# Patient Record
Sex: Female | Born: 1965 | Race: White | Hispanic: No | Smoking: Never smoker
Health system: Southern US, Community
[De-identification: ages and names within clinical notes are randomized; demographics above are authoritative.]

## PROBLEM LIST (undated history)

## (undated) DIAGNOSIS — R519 Headache, unspecified: Secondary | ICD-10-CM

## (undated) DIAGNOSIS — Z9889 Other specified postprocedural states: Secondary | ICD-10-CM

## (undated) DIAGNOSIS — R42 Dizziness and giddiness: Secondary | ICD-10-CM

## (undated) DIAGNOSIS — T753XXA Motion sickness, initial encounter: Secondary | ICD-10-CM

## (undated) DIAGNOSIS — K219 Gastro-esophageal reflux disease without esophagitis: Secondary | ICD-10-CM

## (undated) DIAGNOSIS — L309 Dermatitis, unspecified: Secondary | ICD-10-CM

## (undated) DIAGNOSIS — R112 Nausea with vomiting, unspecified: Secondary | ICD-10-CM

## (undated) DIAGNOSIS — Z789 Other specified health status: Secondary | ICD-10-CM

## (undated) DIAGNOSIS — Z0282 Encounter for adoption services: Secondary | ICD-10-CM

## (undated) DIAGNOSIS — R51 Headache: Secondary | ICD-10-CM

---

## 1997-10-24 ENCOUNTER — Other Ambulatory Visit: Admission: RE | Admit: 1997-10-24 | Discharge: 1997-10-24 | Payer: Self-pay | Admitting: Obstetrics and Gynecology

## 1998-05-19 HISTORY — PX: SHOULDER SURGERY: SHX246

## 1998-10-26 ENCOUNTER — Other Ambulatory Visit: Admission: RE | Admit: 1998-10-26 | Discharge: 1998-10-26 | Payer: Self-pay | Admitting: Obstetrics and Gynecology

## 1998-12-05 ENCOUNTER — Encounter (INDEPENDENT_AMBULATORY_CARE_PROVIDER_SITE_OTHER): Payer: Self-pay

## 1998-12-05 ENCOUNTER — Other Ambulatory Visit: Admission: RE | Admit: 1998-12-05 | Discharge: 1998-12-05 | Payer: Self-pay | Admitting: Obstetrics and Gynecology

## 2000-01-30 ENCOUNTER — Other Ambulatory Visit: Admission: RE | Admit: 2000-01-30 | Discharge: 2000-01-30 | Payer: Self-pay | Admitting: Obstetrics and Gynecology

## 2000-01-30 ENCOUNTER — Encounter (INDEPENDENT_AMBULATORY_CARE_PROVIDER_SITE_OTHER): Payer: Self-pay | Admitting: Specialist

## 2000-06-23 ENCOUNTER — Other Ambulatory Visit: Admission: RE | Admit: 2000-06-23 | Discharge: 2000-06-23 | Payer: Self-pay | Admitting: *Deleted

## 2000-08-11 ENCOUNTER — Ambulatory Visit (HOSPITAL_COMMUNITY): Admission: RE | Admit: 2000-08-11 | Discharge: 2000-08-11 | Payer: Self-pay | Admitting: *Deleted

## 2000-08-11 ENCOUNTER — Encounter: Payer: Self-pay | Admitting: *Deleted

## 2000-09-04 ENCOUNTER — Emergency Department (HOSPITAL_COMMUNITY): Admission: EM | Admit: 2000-09-04 | Discharge: 2000-09-04 | Payer: Self-pay | Admitting: Emergency Medicine

## 2000-10-15 ENCOUNTER — Other Ambulatory Visit: Admission: RE | Admit: 2000-10-15 | Discharge: 2000-10-15 | Payer: Self-pay | Admitting: *Deleted

## 2001-02-18 ENCOUNTER — Other Ambulatory Visit: Admission: RE | Admit: 2001-02-18 | Discharge: 2001-02-18 | Payer: Self-pay | Admitting: Obstetrics and Gynecology

## 2001-05-14 ENCOUNTER — Encounter: Admission: RE | Admit: 2001-05-14 | Discharge: 2001-05-14 | Payer: Self-pay | Admitting: *Deleted

## 2001-05-14 ENCOUNTER — Encounter: Payer: Self-pay | Admitting: *Deleted

## 2001-07-16 ENCOUNTER — Encounter: Admission: RE | Admit: 2001-07-16 | Discharge: 2001-07-16 | Payer: Self-pay | Admitting: Internal Medicine

## 2001-07-16 ENCOUNTER — Encounter: Payer: Self-pay | Admitting: Internal Medicine

## 2001-08-26 ENCOUNTER — Ambulatory Visit (HOSPITAL_COMMUNITY): Admission: RE | Admit: 2001-08-26 | Discharge: 2001-08-26 | Payer: Self-pay | Admitting: Gastroenterology

## 2001-08-26 ENCOUNTER — Encounter: Payer: Self-pay | Admitting: Gastroenterology

## 2002-02-10 ENCOUNTER — Other Ambulatory Visit: Admission: RE | Admit: 2002-02-10 | Discharge: 2002-02-10 | Payer: Self-pay | Admitting: Obstetrics and Gynecology

## 2003-05-29 ENCOUNTER — Other Ambulatory Visit: Admission: RE | Admit: 2003-05-29 | Discharge: 2003-05-29 | Payer: Self-pay | Admitting: Obstetrics and Gynecology

## 2004-04-05 ENCOUNTER — Ambulatory Visit: Payer: Self-pay | Admitting: Family Medicine

## 2004-04-07 ENCOUNTER — Ambulatory Visit (HOSPITAL_COMMUNITY): Admission: RE | Admit: 2004-04-07 | Discharge: 2004-04-07 | Payer: Self-pay | Admitting: Family Medicine

## 2004-05-19 HISTORY — PX: ANKLE SURGERY: SHX546

## 2004-06-19 ENCOUNTER — Encounter: Admission: RE | Admit: 2004-06-19 | Discharge: 2004-06-19 | Payer: Self-pay | Admitting: Obstetrics and Gynecology

## 2005-04-21 ENCOUNTER — Ambulatory Visit: Payer: Self-pay | Admitting: Family Medicine

## 2006-08-10 ENCOUNTER — Ambulatory Visit: Payer: Self-pay | Admitting: Family Medicine

## 2007-08-26 ENCOUNTER — Telehealth (INDEPENDENT_AMBULATORY_CARE_PROVIDER_SITE_OTHER): Payer: Self-pay | Admitting: *Deleted

## 2008-05-19 HISTORY — PX: AUGMENTATION MAMMAPLASTY: SUR837

## 2009-07-21 ENCOUNTER — Ambulatory Visit: Payer: Self-pay | Admitting: Diagnostic Radiology

## 2009-07-21 ENCOUNTER — Emergency Department (HOSPITAL_BASED_OUTPATIENT_CLINIC_OR_DEPARTMENT_OTHER): Admission: EM | Admit: 2009-07-21 | Discharge: 2009-07-21 | Payer: Self-pay | Admitting: Emergency Medicine

## 2011-10-10 ENCOUNTER — Other Ambulatory Visit: Payer: Self-pay | Admitting: *Deleted

## 2011-10-10 DIAGNOSIS — Z1231 Encounter for screening mammogram for malignant neoplasm of breast: Secondary | ICD-10-CM

## 2011-10-31 ENCOUNTER — Ambulatory Visit
Admission: RE | Admit: 2011-10-31 | Discharge: 2011-10-31 | Disposition: A | Discharge status: Home / Self Care | Payer: Managed Care, Other (non HMO) | Source: Ambulatory Visit | Attending: *Deleted | Admitting: *Deleted

## 2011-10-31 ENCOUNTER — Other Ambulatory Visit: Payer: Self-pay | Admitting: *Deleted

## 2011-10-31 DIAGNOSIS — Z1231 Encounter for screening mammogram for malignant neoplasm of breast: Secondary | ICD-10-CM

## 2011-11-05 ENCOUNTER — Other Ambulatory Visit: Payer: Self-pay | Admitting: *Deleted

## 2011-11-05 DIAGNOSIS — R928 Other abnormal and inconclusive findings on diagnostic imaging of breast: Secondary | ICD-10-CM

## 2011-11-06 ENCOUNTER — Other Ambulatory Visit: Payer: Self-pay | Admitting: Family Medicine

## 2011-11-14 ENCOUNTER — Ambulatory Visit
Admission: RE | Admit: 2011-11-14 | Discharge: 2011-11-14 | Disposition: A | Discharge status: Home / Self Care | Payer: Managed Care, Other (non HMO) | Source: Ambulatory Visit | Attending: *Deleted | Admitting: *Deleted

## 2011-11-14 DIAGNOSIS — R928 Other abnormal and inconclusive findings on diagnostic imaging of breast: Secondary | ICD-10-CM

## 2012-02-11 ENCOUNTER — Ambulatory Visit: Payer: Self-pay | Admitting: Family Medicine

## 2012-03-29 ENCOUNTER — Ambulatory Visit: Payer: Self-pay | Admitting: Medical

## 2012-03-29 LAB — URINALYSIS, COMPLETE
Bilirubin,UR: NEGATIVE
Ketone: NEGATIVE
Leukocyte Esterase: NEGATIVE
RBC,UR: NONE SEEN /HPF (ref 0–5)

## 2012-03-29 LAB — PREGNANCY, URINE: Pregnancy Test, Urine: NEGATIVE m[IU]/mL

## 2012-03-29 LAB — WET PREP, GENITAL

## 2012-03-31 LAB — URINE CULTURE

## 2012-04-02 ENCOUNTER — Ambulatory Visit: Payer: Self-pay | Admitting: Medical

## 2013-05-28 ENCOUNTER — Ambulatory Visit: Payer: Self-pay | Admitting: Physician Assistant

## 2013-07-09 ENCOUNTER — Ambulatory Visit: Payer: Self-pay | Admitting: Internal Medicine

## 2013-08-11 ENCOUNTER — Ambulatory Visit: Payer: Self-pay | Admitting: Internal Medicine

## 2013-08-11 ENCOUNTER — Ambulatory Visit: Payer: Self-pay | Admitting: Gastroenterology

## 2013-08-12 LAB — PATHOLOGY REPORT

## 2013-08-26 ENCOUNTER — Ambulatory Visit: Payer: Self-pay | Admitting: Internal Medicine

## 2014-04-30 ENCOUNTER — Ambulatory Visit: Payer: Self-pay | Admitting: Physician Assistant

## 2014-04-30 LAB — RAPID STREP-A WITH REFLX: Micro Text Report: NEGATIVE

## 2014-05-03 LAB — BETA STREP CULTURE(ARMC)

## 2014-05-14 ENCOUNTER — Ambulatory Visit: Payer: Self-pay | Admitting: Family Medicine

## 2014-05-14 LAB — RAPID STREP-A WITH REFLX: MICRO TEXT REPORT: NEGATIVE

## 2014-05-17 LAB — BETA STREP CULTURE(ARMC)

## 2014-11-05 ENCOUNTER — Encounter: Payer: Self-pay | Admitting: Emergency Medicine

## 2014-11-05 ENCOUNTER — Ambulatory Visit
Admission: EM | Admit: 2014-11-05 | Discharge: 2014-11-05 | Disposition: A | Discharge status: Home / Self Care | Payer: Managed Care, Other (non HMO) | Attending: Family Medicine | Admitting: Family Medicine

## 2014-11-05 DIAGNOSIS — W57XXXA Bitten or stung by nonvenomous insect and other nonvenomous arthropods, initial encounter: Secondary | ICD-10-CM | POA: Diagnosis not present

## 2014-11-05 DIAGNOSIS — T148 Other injury of unspecified body region: Secondary | ICD-10-CM

## 2014-11-05 DIAGNOSIS — S20469A Insect bite (nonvenomous) of unspecified back wall of thorax, initial encounter: Secondary | ICD-10-CM | POA: Diagnosis not present

## 2014-11-05 DIAGNOSIS — R21 Rash and other nonspecific skin eruption: Secondary | ICD-10-CM | POA: Diagnosis present

## 2014-11-05 HISTORY — DX: Headache, unspecified: R51.9

## 2014-11-05 HISTORY — DX: Dermatitis, unspecified: L30.9

## 2014-11-05 HISTORY — DX: Headache: R51

## 2014-11-05 LAB — URINALYSIS COMPLETE WITH MICROSCOPIC (ARMC ONLY)
BACTERIA UA: NONE SEEN — AB
BILIRUBIN URINE: NEGATIVE
Glucose, UA: NEGATIVE mg/dL
HGB URINE DIPSTICK: NEGATIVE
Ketones, ur: NEGATIVE mg/dL
LEUKOCYTES UA: NEGATIVE
NITRITE: NEGATIVE
PROTEIN: NEGATIVE mg/dL
RBC / HPF: NONE SEEN RBC/hpf (ref <3)
Specific Gravity, Urine: 1.025 (ref 1.005–1.030)
pH: 6.5 (ref 5.0–8.0)

## 2014-11-05 NOTE — ED Notes (Addendum)
Rash for 5 days on Torso and under breast. Has been exposed to Shingles. Odor when urinating today.

## 2014-11-07 LAB — URINE CULTURE

## 2014-11-09 ENCOUNTER — Encounter: Payer: Self-pay | Admitting: Physician Assistant

## 2014-11-09 NOTE — ED Provider Notes (Signed)
CSN: [id number]     Arrival date & time 11/05/14  1547 History   None    Chief Complaint  Patient presents with  . Rash   (Consider location/radiation/quality/duration/timing/severity/associated sxs/prior Treatment) HPI  49 yo F with known hx eczema. Anxious about a "new rash" noted in past few days. Mild itch, not spreading, no pain, no fever, no malaise. Along bra line on abdomen/thorax. Particularly anxious because a friend just got diagnosed with Zoster.Also concerned because urine was "more yellow" today and might have had an odor-denies dysuria or frequency;fever or NVD back pain  Past Medical History  Diagnosis Date  . Eczema   . Headache    Past Surgical History  Procedure Laterality Date  . Shoulder surgery Right   . Ankle surgery Right 2006   History reviewed. No pertinent family history. History  Substance Use Topics  . Smoking status: Never Smoker   . Smokeless tobacco: Never Used  . Alcohol Use: No   OB History    No data available     Review of Systems Review of 10 systems negative for acute change except as referenced in HPI  Allergies  Review of patient's allergies indicates no known allergies.  Home Medications   Prior to Admission medications   Medication Sig Start Date End Date Taking? Authorizing Provider  dexlansoprazole (DEXILANT) 60 MG capsule Take 60 mg by mouth daily.   Yes Historical Provider, MD  Dexamethasone Ace & Sod Phos (DEX COMBO) 8-4 MG/ML SUSP Inject as directed.    Historical Provider, MD   BP 118/70 mmHg  Pulse 87  Temp(Src) 97.9 F (36.6 C) (Oral)  Resp 16  Ht 5\' 2"  (1.575 m)  Wt 158 lb (71.668 kg)  BMI 28.89 kg/m2  SpO2 99%  LMP 11/01/2014 Physical Exam  Constitutional -alert and oriented,well appearing and in no acute distress Head-atraumatic Eyes- ,conjugate gaze Nose- no congestion or rhinorrhea Mouth/throat- mucous membranes moist  Neck- supple without glandular enlargement CV- regular rate, grossly normal heart  sounds, Resp-no distress, normal respiratory effort,clear to auscultation bilaterally GI- soft,non-tender,no distention...there are 4 tiny red spots along lower edge of bra elastic--very representative of small insect bite. Not inflamed, not infected-appear to be resolving Back- WNL no CVAT GU- not examined MSK- no lower extremity tenderness nor edema,no joint effusion, ambulatory Neuro- normal speech and language,  Skin-warm,dry ,intact; no rash noted- see GI- suspect resolving insect bite Psych-mood and affect grossly normal; speech and behavior grossly normal  ED Course  Procedures (including critical care time) Labs Review Labs Reviewed  URINALYSIS COMPLETEWITH MICROSCOPIC ([facility] ONLY) - Abnormal; Notable for the following:    Color, Urine STRAW (*)    Bacteria, UA NONE SEEN (*)    Squamous Epithelial / LPF 0-5 (*)    All other components within normal limits  URINE CULTURE    Imaging Review No results found.   MDM   1. Insect bite     Findings suggest resolving insect bite and patient is reassured. Urine is negative and she is encouraged to increase her po fluids. She is relaxed and comfortable by her report at discharge Diagnosis and treatment discussed. . Questions fielded, expectations and recommendations reviewed. Patient expresses understanding. Will return to [facility] with questions, concern or exacerbation.     Jan Fireman, PA-C 11/09/14 [phone]

## 2015-03-31 ENCOUNTER — Other Ambulatory Visit: Payer: Self-pay | Admitting: Gastroenterology

## 2015-08-16 ENCOUNTER — Encounter: Payer: Self-pay | Admitting: Emergency Medicine

## 2015-08-16 ENCOUNTER — Ambulatory Visit
Admission: EM | Admit: 2015-08-16 | Discharge: 2015-08-16 | Disposition: A | Discharge status: Home / Self Care | Payer: Managed Care, Other (non HMO) | Attending: Family Medicine | Admitting: Family Medicine

## 2015-08-16 DIAGNOSIS — R42 Dizziness and giddiness: Secondary | ICD-10-CM

## 2015-08-16 DIAGNOSIS — H81319 Aural vertigo, unspecified ear: Secondary | ICD-10-CM | POA: Diagnosis not present

## 2015-08-16 DIAGNOSIS — R112 Nausea with vomiting, unspecified: Secondary | ICD-10-CM | POA: Diagnosis not present

## 2015-08-16 MED ORDER — MECLIZINE HCL 25 MG PO TABS
25.0000 mg | ORAL_TABLET | Freq: Three times a day (TID) | ORAL | Status: DC | PRN
  Administered 2015-08-16: 25 mg via ORAL

## 2015-08-16 MED ORDER — ONDANSETRON 8 MG PO TBDP: 8.0000 mg | ORAL_TABLET | Freq: Three times a day (TID) | ORAL | Status: DC | PRN

## 2015-08-16 MED ORDER — ONDANSETRON 8 MG PO TBDP
8.0000 mg | ORAL_TABLET | Freq: Once | ORAL | Status: AC
  Administered 2015-08-16: 8 mg via ORAL

## 2015-08-16 MED ORDER — MECLIZINE HCL 25 MG PO TABS: 25.0000 mg | ORAL_TABLET | Freq: Three times a day (TID) | ORAL | Status: DC | PRN

## 2015-08-16 NOTE — Discharge Instructions (Signed)
Dizziness Dizziness is a common problem. It makes you feel unsteady or lightheaded. You may feel like you are about to pass out (faint). Dizziness can lead to injury if you stumble or fall. Anyone can get dizzy, but dizziness is more common in older adults. This condition can be caused by a number of things, including:  Medicines.  Dehydration.  Illness. HOME CARE Following these instructions may help with your condition: Eating and Drinking  Drink enough fluid to keep your pee (urine) clear or pale yellow. This helps to keep you from getting dehydrated. Try to drink more clear fluids, such as water.  Do not drink alcohol.  Limit how much caffeine you drink or eat if told by your doctor.  Limit how much salt you drink or eat if told by your doctor. Activity  Avoid making quick movements.  When you stand up from sitting in a chair, steady yourself until you feel okay.  In the morning, first sit up on the side of the bed. When you feel okay, stand slowly while you hold onto something. Do this until you know that your balance is fine.  Move your legs often if you need to stand in one place for a long time. Tighten and relax your muscles in your legs while you are standing.  Do not drive or use heavy machinery if you feel dizzy.  Avoid bending down if you feel dizzy. Place items in your home so that they are easy for you to reach without leaning over. Lifestyle  Do not use any tobacco products, including cigarettes, chewing tobacco, or electronic cigarettes. If you need help quitting, ask your doctor.  Try to lower your stress level, such as with yoga or meditation. Talk with your doctor if you need help. General Instructions  Watch your dizziness for any changes.  Take medicines only as told by your doctor. Talk with your doctor if you think that your dizziness is caused by a medicine that you are taking.  Tell a friend or a family member that you are feeling dizzy. If he or  she notices any changes in your behavior, have this person call your doctor.  Keep all follow-up visits as told by your doctor. This is important. GET HELP IF:  Your dizziness does not go away.  Your dizziness or light-headedness gets worse.  You feel sick to your stomach (nauseous).  You have trouble hearing.  You have new symptoms.  You are unsteady on your feet or you feel like the room is spinning. GET HELP RIGHT AWAY IF:  You throw up (vomit) or have diarrhea and are unable to eat or drink anything.  You have trouble:  Talking.  Walking.  Swallowing.  Using your arms, hands, or legs.  You feel generally weak.  You are not thinking clearly or you have trouble forming sentences. It may take a friend or family member to notice this.  You have:  Chest pain.  Pain in your belly (abdomen).  Shortness of breath.  Sweating.  Your vision changes.  You are bleeding.  You have a headache.  You have neck pain or a stiff neck.  You have a fever.   This information is not intended to replace advice given to you by your health care provider. Make sure you discuss any questions you have with your health care provider.   Document Released: 04/24/2011 Document Revised: 09/19/2014 Document Reviewed: 05/01/2014 Elsevier Interactive Patient Education 2016 Elsevier Inc.  Vertigo Vertigo means that  you feel like you are moving when you are not. Vertigo can also make you feel like things around you are moving when they are not. This feeling can come and go at any time. Vertigo often goes away on its own. HOME CARE  Avoid making fast movements.  Avoid driving.  Avoid using heavy machinery.  Avoid doing any task or activity that might cause danger to you or other people if you would have a vertigo attack while you are doing it.  Sit down right away if you feel dizzy or have trouble with your balance.  Take over-the-counter and prescription medicines only as told  by your doctor.  Follow instructions from your doctor about which positions or movements you should avoid.  Drink enough fluid to keep your pee (urine) clear or pale yellow.  Keep all follow-up visits as told by your doctor. This is important. GET HELP IF:  Medicine does not help your vertigo.  You have a fever.  Your problems get worse or you have new symptoms.  Your family or friends see changes in your behavior.  You feel sick to your stomach (nauseous) or you throw up (vomit).  You have a "pins and needles" feeling or you are numb in part of your body. GET HELP RIGHT AWAY IF:  You have trouble moving or talking.  You are always dizzy.  You pass out (faint).  You get very bad headaches.  You feel weak or have trouble using your hands, arms, or legs.  You have changes in your hearing.  You have changes in your seeing (vision).  You get a stiff neck.  Bright light starts to bother you.   This information is not intended to replace advice given to you by your health care provider. Make sure you discuss any questions you have with your health care provider.   Document Released: 02/12/2008 Document Revised: 01/24/2015 Document Reviewed: 08/28/2014 Elsevier Interactive Patient Education [employer].

## 2015-08-16 NOTE — ED Provider Notes (Signed)
CSN: [id number]     Arrival date & time 08/16/15  1346 History   First MD Initiated Contact with Patient 08/16/15 1433    Nurses notes were reviewed. Chief Complaint  Patient presents with  . Nausea  . Dizziness   Patient reports 4:00 this morning she felt lightheaded dizzy and felt swimming as she was drunk. She got up and saw that there was 4:00 on her watch that had a big face to it. 20 bathroom and threw up she started about 2-3 more times since then to get some Dramamine which has help she just feels lightheaded and dizzy as if she is drunk.  Patient is adopted she's had shoulder surgery ankle surgery and breast surgery. She is currently going to weight loss clinic is on several medications and treatments for that she's been on it for over a week now. She is also taking phentermine for a taking half a dose now and she's never had trouble with phentermine before.  Patient does not smoke and states that she she is allergic to latex   (Consider location/radiation/quality/duration/timing/severity/associated sxs/prior Treatment) Patient is a 50 y.o. female presenting with dizziness. The history is provided by the patient. No language interpreter was used.  Dizziness Quality:  Head spinning, lightheadedness and room spinning Severity:  Moderate Onset quality:  Sudden Duration:  12 hours Timing:  Constant Progression:  Waxing and waning Chronicity:  New Relieved by: Dramamine. Worsened by:  Movement Ineffective treatments:  Change in position Associated symptoms: nausea, vomiting and weakness   Risk factors: multiple medications and new medications   Risk factors: no hx of vertigo     Past Medical History  Diagnosis Date  . Eczema   . Headache    Past Surgical History  Procedure Laterality Date  . Shoulder surgery Right   . Ankle surgery Right 2006  . Breast surgery     History reviewed. No pertinent family history. Social History  Substance Use Topics  . Smoking status:  Never Smoker   . Smokeless tobacco: Never Used  . Alcohol Use: No   OB History    No data available     Review of Systems  Gastrointestinal: Positive for nausea and vomiting.  Neurological: Positive for dizziness and weakness.  All other systems reviewed and are negative.   Allergies  Review of patient's allergies indicates no known allergies.  Home Medications   Prior to Admission medications   Medication Sig Start Date End Date Taking? Authorizing Provider  Cyanocobalamin (B-12 COMPLIANCE INJECTION) 1000 MCG/ML KIT Inject as directed.   Yes Historical Provider, MD  human chorionic gonadotropin (PREGNYL/NOVAREL) 10000 units injection Inject 25 Units into the muscle once.   Yes Historical Provider, MD  Nutritional Supplements (LIPOTROPIC COMPLEX PO) Take 50 mg by mouth.   Yes Historical Provider, MD  omeprazole (PRILOSEC) 40 MG capsule Take 40 mg by mouth daily.   Yes Historical Provider, MD  PHENTERMINE HCL PO Take 1 tablet by mouth daily.   Yes Historical Provider, MD  Dexamethasone Ace & Sod Phos (DEX COMBO) 8-4 MG/ML SUSP Inject as directed.    Historical Provider, MD  DEXILANT 60 MG capsule TAKE ONE CAPSULE BY MOUTH EVERY DAY 04/02/15   Lucilla Lame, MD  meclizine (ANTIVERT) 25 MG tablet Take 1 tablet (25 mg total) by mouth 3 (three) times daily as needed for dizziness. 08/16/15   Frederich Cha, MD  ondansetron (ZOFRAN ODT) 8 MG disintegrating tablet Take 1 tablet (8 mg total) by mouth every 8 (  eight) hours as needed for nausea or vomiting. 08/16/15   Frederich Cha, MD   Meds Ordered and Administered this Visit   Medications  meclizine (ANTIVERT) tablet 25 mg (25 mg Oral Given 08/16/15 1508)  ondansetron (ZOFRAN-ODT) disintegrating tablet 8 mg (8 mg Oral Given 08/16/15 1508)    BP 109/48 mmHg  Pulse 93  Temp(Src) 98.8 F (37.1 C) (Tympanic)  Resp 16  Ht '5\' 2"'$  (1.575 m)  Wt 156 lb (70.761 kg)  BMI 28.53 kg/m2  SpO2 99%  LMP 07/21/2015 (Exact Date) No data  found.   Physical Exam  Constitutional: She is oriented to person, place, and time. She appears well-developed.  HENT:  Head: Normocephalic.  Right Ear: Hearing, tympanic membrane, external ear and ear canal normal.  Left Ear: Hearing, tympanic membrane, external ear and ear canal normal.  Nose: No mucosal edema or rhinorrhea.  Mouth/Throat: Normal dentition. No dental caries. No oropharyngeal exudate or posterior oropharyngeal edema.  Eyes: Conjunctivae are normal. Pupils are equal, round, and reactive to light.  Neck: Normal range of motion. Neck supple.  Cardiovascular: Normal rate and regular rhythm.   Pulmonary/Chest: Effort normal and breath sounds normal.  Abdominal: Soft.  Musculoskeletal: Normal range of motion. She exhibits no edema.  Neurological: She is alert and oriented to person, place, and time.  Skin: Skin is warm and dry.  Psychiatric: She has a normal mood and affect.  Vitals reviewed.   ED Course  Procedures (including critical care time)  Labs Review Labs Reviewed - No data to display  Imaging Review No results found.   Visual Acuity Review  Right Eye Distance:   Left Eye Distance:   Bilateral Distance:    Right Eye Near:   Left Eye Near:    Bilateral Near:         MDM   1. Vertigo, aural, unspecified laterality   2. Dizziness   3. Non-intractable vomiting with nausea, vomiting of unspecified type     Attempts were made to do reverse rotation of the head would help this Mnire's disease did not. We'll place patient on Zofran of Zofran Antivert works great not answer Phenergan and Antivert respect things be better to 3 days if not she'll need to come back for follow-up with see her PCP for follow-up.  Patient reports feeling much improved since taking the Zofran and Antivert here.  Note: This dictation was prepared with Dragon dictation along with smaller phrase technology. Any transcriptional errors that result from this process are  unintentional.   Frederich Cha, MD 08/16/15 [phone]

## 2015-08-16 NOTE — ED Notes (Signed)
Patient c/o dizziness and nausea that started early this morning. Patient states that she took a dramamine tablet and had some improvement.

## 2015-08-17 ENCOUNTER — Encounter: Payer: Self-pay | Admitting: Internal Medicine

## 2015-09-11 ENCOUNTER — Ambulatory Visit (INDEPENDENT_AMBULATORY_CARE_PROVIDER_SITE_OTHER): Payer: Managed Care, Other (non HMO) | Admitting: Internal Medicine

## 2015-09-11 ENCOUNTER — Encounter: Payer: Self-pay | Admitting: Internal Medicine

## 2015-09-11 VITALS — BP 118/80 | HR 84 | Ht 62.0 in | Wt 154.4 lb

## 2015-09-11 DIAGNOSIS — E559 Vitamin D deficiency, unspecified: Secondary | ICD-10-CM | POA: Insufficient documentation

## 2015-09-11 DIAGNOSIS — N6320 Unspecified lump in the left breast, unspecified quadrant: Secondary | ICD-10-CM

## 2015-09-11 DIAGNOSIS — N63 Unspecified lump in breast: Secondary | ICD-10-CM

## 2015-09-11 DIAGNOSIS — K219 Gastro-esophageal reflux disease without esophagitis: Secondary | ICD-10-CM | POA: Insufficient documentation

## 2015-09-11 DIAGNOSIS — E782 Mixed hyperlipidemia: Secondary | ICD-10-CM | POA: Insufficient documentation

## 2015-09-11 DIAGNOSIS — R42 Dizziness and giddiness: Secondary | ICD-10-CM | POA: Diagnosis not present

## 2015-09-11 DIAGNOSIS — Z8742 Personal history of other diseases of the female genital tract: Secondary | ICD-10-CM | POA: Insufficient documentation

## 2015-09-11 DIAGNOSIS — G43909 Migraine, unspecified, not intractable, without status migrainosus: Secondary | ICD-10-CM | POA: Insufficient documentation

## 2015-09-11 NOTE — Patient Instructions (Signed)
Breast Self-Awareness Practicing breast self-awareness may pick up problems early, prevent significant medical complications, and possibly save your life. By practicing breast self-awareness, you can become familiar with how your breasts look and feel and if your breasts are changing. This allows you to notice changes early. It can also offer you some reassurance that your breast health is good. One way to learn what is normal for your breasts and whether your breasts are changing is to do a breast self-exam. If you find a lump or something that was not present in the past, it is best to contact your caregiver right away. Other findings that should be evaluated by your caregiver include nipple discharge, especially if it is bloody; skin changes or reddening; areas where the skin seems to be pulled in (retracted); or new lumps and bumps. Breast pain is seldom associated with cancer (malignancy), but should also be evaluated by a caregiver. HOW TO PERFORM A BREAST SELF-EXAM The best time to examine your breasts is 5-7 days after your menstrual period is over. During menstruation, the breasts are lumpier, and it may be more difficult to pick up changes. If you do not menstruate, have reached menopause, or had your uterus removed (hysterectomy), you should examine your breasts at regular intervals, such as monthly. If you are breastfeeding, examine your breasts after a feeding or after using a breast pump. Breast implants do not decrease the risk for lumps or tumors, so continue to perform breast self-exams as recommended. Talk to your caregiver about how to determine the difference between the implant and breast tissue. Also, talk about the amount of pressure you should use during the exam. Over time, you will become more familiar with the variations of your breasts and more comfortable with the exam. A breast self-exam requires you to remove all your clothes above the waist. 1. Look at your breasts and nipples.  Stand in front of a mirror in a room with good lighting. With your hands on your hips, push your hands firmly downward. Look for a difference in shape, contour, and size from one breast to the other (asymmetry). Asymmetry includes puckers, dips, or bumps. Also, look for skin changes, such as reddened or scaly areas on the breasts. Look for nipple changes, such as discharge, dimpling, repositioning, or redness. 2. Carefully feel your breasts. This is best done either in the shower or tub while using soapy water or when flat on your back. Place the arm (on the side of the breast you are examining) above your head. Use the pads (not the fingertips) of your three middle fingers on your opposite hand to feel your breasts. Start in the underarm area and use  inch (2 cm) overlapping circles to feel your breast. Use 3 different levels of pressure (light, medium, and firm pressure) at each circle before moving to the next circle. The light pressure is needed to feel the tissue closest to the skin. The medium pressure will help to feel breast tissue a little deeper, while the firm pressure is needed to feel the tissue close to the ribs. Continue the overlapping circles, moving downward over the breast until you feel your ribs below your breast. Then, move one finger-width towards the center of the body. Continue to use the  inch (2 cm) overlapping circles to feel your breast as you move slowly up toward the collar bone (clavicle) near the base of the neck. Continue the up and down exam using all 3 pressures until you reach the   middle of the chest. Do this with each breast, carefully feeling for lumps or changes. 3.  Keep a written record with breast changes or normal findings for each breast. By writing this information down, you do not need to depend only on memory for size, tenderness, or location. Write down where you are in your menstrual cycle, if you are still menstruating. Breast tissue can have some lumps or  thick tissue. However, see your caregiver if you find anything that concerns you.  SEEK MEDICAL CARE IF:  You see a change in shape, contour, or size of your breasts or nipples.   You see skin changes, such as reddened or scaly areas on the breasts or nipples.   You have an unusual discharge from your nipples.   You feel a new lump or unusually thick areas.    This information is not intended to replace advice given to you by your health care provider. Make sure you discuss any questions you have with your health care provider.   Document Released: 05/05/2005 Document Revised: 04/21/2012 Document Reviewed: 08/20/2011 Elsevier Interactive Patient Education 2016 Elsevier Inc.  

## 2015-09-11 NOTE — Progress Notes (Signed)
Date:  09/11/2015   Name:  Caitlin Ware   DOB:  1965-10-23   MRN:  [id number]   Chief Complaint: Breast Pain and Dizziness Dizziness This is a new problem. The current episode started 1 to 4 weeks ago. Progression since onset: lasted 3 days last month, now mild recurrence since Sunday. Pertinent negatives include no abdominal pain, arthralgias, chest pain, chills, congestion, coughing, fatigue, fever, headaches, joint swelling or weakness.   Breast discomfort - off and on on the left breast after exercise.  Seems to be since surgery.  She did more extensive exercise Saturday and now she has a lump on the left.  Her last mammogram was 2015 - she was supposed to have a 6 month follow up but did not follow through.   Review of Systems  Constitutional: Negative for fever, chills and fatigue.  HENT: Negative for congestion, ear pain and sinus pressure.   Respiratory: Negative for cough, chest tightness and shortness of breath.   Cardiovascular: Negative for chest pain.  Gastrointestinal: Negative for abdominal pain.  Musculoskeletal: Negative for joint swelling and arthralgias.  Neurological: Positive for dizziness. Negative for syncope, weakness and headaches.    Patient Active Problem List   Diagnosis Date Noted  . Migraine headache 09/11/2015  . GERD (gastroesophageal reflux disease) 09/11/2015  . Hyperlipidemia, mixed 09/11/2015  . Hx of abnormal cervical Papanicolaou smear 09/11/2015    Prior to Admission medications   Medication Sig Start Date End Date Taking? Authorizing Provider  Cetirizine HCl 10 MG TBDP Take by mouth.   Yes Historical Provider, MD  meclizine (ANTIVERT) 25 MG tablet Take 1 tablet (25 mg total) by mouth 3 (three) times daily as needed for dizziness. 08/16/15  Yes Frederich Cha, MD  PHENTERMINE HCL PO Take 1 tablet by mouth daily.   Yes Historical Provider, MD    No Known Allergies  Past Surgical History  Procedure Laterality Date  . Shoulder  surgery Right 2000  . Ankle surgery Right 2006  . Breast enhancement surgery  2010    Social History  Substance Use Topics  . Smoking status: Never Smoker   . Smokeless tobacco: Never Used  . Alcohol Use: No    Medication list has been reviewed and updated.   Physical Exam  Constitutional: She appears well-developed and well-nourished.  Eyes: Conjunctivae are normal. Pupils are equal, round, and reactive to light. Right eye exhibits nystagmus. Right eye exhibits normal extraocular motion. Left eye exhibits nystagmus. Left eye exhibits normal extraocular motion.  Neck: Normal range of motion. Neck supple.  Cardiovascular: Normal rate, regular rhythm and normal heart sounds.   Pulmonary/Chest: Effort normal. Right breast exhibits no inverted nipple, no mass, no nipple discharge, no skin change and no tenderness. Left breast exhibits no inverted nipple, no mass, no nipple discharge, no skin change (2 small masses at 5 and 6 oclock on left) and no tenderness.  Musculoskeletal: She exhibits no edema or tenderness.  Nursing note and vitals reviewed.   BP 118/80 mmHg  Pulse 84  Ht 5\' 2"  (1.575 m)  Wt 154 lb 6.4 oz (70.035 kg)  BMI 28.23 kg/m2  LMP 08/21/2015  Assessment and Plan: 1. Left breast mass - MM Digital Diagnostic Bilat; Future - US BREAST LTD UNI LEFT INC AXILLA; Future - US BREAST LTD UNI RIGHT INC AXILLA; Future  2. Vertigo Use meclizine as needed - expect resolution in several days   [name], MD [facility]  09/11/2015  

## 2015-09-14 ENCOUNTER — Ambulatory Visit: Payer: Self-pay | Admitting: Internal Medicine

## 2015-09-27 ENCOUNTER — Ambulatory Visit
Admission: RE | Admit: 2015-09-27 | Discharge: 2015-09-27 | Disposition: A | Discharge status: Home / Self Care | Payer: Managed Care, Other (non HMO) | Source: Ambulatory Visit | Attending: Internal Medicine | Admitting: Internal Medicine

## 2015-09-27 ENCOUNTER — Other Ambulatory Visit: Payer: Self-pay | Admitting: Internal Medicine

## 2015-09-27 DIAGNOSIS — N6320 Unspecified lump in the left breast, unspecified quadrant: Secondary | ICD-10-CM

## 2015-09-27 DIAGNOSIS — Z9882 Breast implant status: Secondary | ICD-10-CM | POA: Diagnosis not present

## 2015-09-27 DIAGNOSIS — R928 Other abnormal and inconclusive findings on diagnostic imaging of breast: Secondary | ICD-10-CM | POA: Insufficient documentation

## 2015-09-27 DIAGNOSIS — N63 Unspecified lump in breast: Secondary | ICD-10-CM | POA: Diagnosis present

## 2015-10-01 ENCOUNTER — Other Ambulatory Visit: Payer: Managed Care, Other (non HMO)

## 2015-10-01 ENCOUNTER — Ambulatory Visit: Payer: Managed Care, Other (non HMO)

## 2015-12-31 ENCOUNTER — Ambulatory Visit (INDEPENDENT_AMBULATORY_CARE_PROVIDER_SITE_OTHER): Payer: Managed Care, Other (non HMO) | Admitting: Internal Medicine

## 2015-12-31 ENCOUNTER — Encounter: Payer: Self-pay | Admitting: Internal Medicine

## 2015-12-31 VITALS — BP 124/81 | HR 74 | Resp 16 | Ht 62.0 in | Wt 144.8 lb

## 2015-12-31 DIAGNOSIS — G43009 Migraine without aura, not intractable, without status migrainosus: Secondary | ICD-10-CM

## 2015-12-31 DIAGNOSIS — Z1211 Encounter for screening for malignant neoplasm of colon: Secondary | ICD-10-CM

## 2015-12-31 DIAGNOSIS — Z114 Encounter for screening for human immunodeficiency virus [HIV]: Secondary | ICD-10-CM | POA: Diagnosis not present

## 2015-12-31 DIAGNOSIS — Z0000 Encounter for general adult medical examination without abnormal findings: Secondary | ICD-10-CM

## 2015-12-31 DIAGNOSIS — Z124 Encounter for screening for malignant neoplasm of cervix: Secondary | ICD-10-CM

## 2015-12-31 LAB — POCT URINALYSIS DIPSTICK
BILIRUBIN UA: NEGATIVE
Blood, UA: NEGATIVE
Glucose, UA: NEGATIVE
KETONES UA: NEGATIVE
Leukocytes, UA: NEGATIVE
Nitrite, UA: NEGATIVE
PH UA: 7
PROTEIN UA: NEGATIVE
SPEC GRAV UA: 1.01

## 2015-12-31 MED ORDER — SUMATRIPTAN SUCCINATE 100 MG PO TABS: 100.0000 mg | ORAL_TABLET | ORAL | 0 refills | Status: AC | PRN

## 2015-12-31 NOTE — Progress Notes (Signed)
Date:  12/31/2015   Name:  Caitlin Ware   DOB:  [birth year]   MRN:  [id number]   Chief Complaint: Annual Exam (PAP today) and Migraine (Need Imitrex ) Caitlin Ware is a 50 y.o. female who presents today for her Complete Annual Exam. She feels well. She reports exercising regularly. She reports she is sleeping well. Mammogram was done recently. Menses are more irregular.  No significant hot flashes.  Migraine   This is a chronic problem. The current episode started more than 1 year ago. The problem occurs intermittently. The problem has been unchanged. The pain quality is similar to prior headaches. Pertinent negatives include no abdominal pain, coughing, dizziness, fever, hearing loss, tinnitus or vomiting. She has tried triptans (botox) for the symptoms. The treatment provided significant relief.    Review of Systems  Constitutional: Negative for chills, fatigue and fever.  HENT: Negative for congestion, hearing loss, tinnitus, trouble swallowing and voice change.   Eyes: Negative for visual disturbance.  Respiratory: Negative for cough, chest tightness, shortness of breath and wheezing.   Cardiovascular: Negative for chest pain, palpitations and leg swelling.  Gastrointestinal: Negative for abdominal pain, constipation, diarrhea and vomiting.  Endocrine: Negative for polydipsia and polyuria.  Genitourinary: Negative for dysuria, frequency, genital sores, vaginal bleeding and vaginal discharge.  Musculoskeletal: Negative for arthralgias, gait problem and joint swelling.  Skin: Negative for color change and rash.  Neurological: Positive for headaches. Negative for dizziness, tremors and light-headedness.  Hematological: Negative for adenopathy. Does not bruise/bleed easily.  Psychiatric/Behavioral: Negative for dysphoric mood and sleep disturbance. The patient is not nervous/anxious.     Patient Active Problem List   Diagnosis Date Noted  . Migraine headache  09/11/2015  . GERD (gastroesophageal reflux disease) 09/11/2015  . Hyperlipidemia, mixed 09/11/2015  . Hx of abnormal cervical Papanicolaou smear 09/11/2015  . Vertigo 09/11/2015  . Vitamin D deficiency 09/11/2015    Prior to Admission medications   Medication Sig Start Date End Date Taking? Authorizing Provider  PHENTERMINE HCL PO Take 1 tablet by mouth daily.    Historical Provider, MD    No Known Allergies  Past Surgical History:  Procedure Laterality Date  . ANKLE SURGERY Right 2006  . AUGMENTATION MAMMAPLASTY  2010  . BREAST ENHANCEMENT SURGERY  2010  . SHOULDER SURGERY Right 2000    Social History  Substance Use Topics  . Smoking status: Never Smoker  . Smokeless tobacco: Never Used  . Alcohol use No     Medication list has been reviewed and updated.   Physical Exam  Constitutional: She is oriented to person, place, and time. She appears well-developed and well-nourished. No distress.  HENT:  Head: Normocephalic and atraumatic.  Right Ear: Tympanic membrane and ear canal normal.  Left Ear: Tympanic membrane and ear canal normal.  Nose: Right sinus exhibits no maxillary sinus tenderness. Left sinus exhibits no maxillary sinus tenderness.  Mouth/Throat: Uvula is midline and oropharynx is clear and moist.  Eyes: Conjunctivae and EOM are normal. Right eye exhibits no discharge. Left eye exhibits no discharge. No scleral icterus.  Neck: Normal range of motion. Carotid bruit is not present. No erythema present. No thyromegaly present.  Cardiovascular: Normal rate, regular rhythm, normal heart sounds and normal pulses.   Pulmonary/Chest: Effort normal. No respiratory distress. She has no wheezes. Right breast exhibits no mass, no nipple discharge, no skin change and no tenderness. Left breast exhibits no mass, no nipple discharge, no skin change and no tenderness.  Surgical changes of breast augmentation noted both breasts  Abdominal: Soft. Bowel sounds are normal. There  is no hepatosplenomegaly. There is no tenderness. There is no CVA tenderness.  Genitourinary: Rectum normal, vagina normal and uterus normal. There is no rash or tenderness on the right labia. There is no rash or tenderness on the left labia. Cervix exhibits no motion tenderness and no discharge. Right adnexum displays no mass, no tenderness and no fullness. Left adnexum displays no mass, no tenderness and no fullness.  Musculoskeletal: Normal range of motion.  Lymphadenopathy:    She has no cervical adenopathy.    She has no axillary adenopathy.  Neurological: She is alert and oriented to person, place, and time. She has normal reflexes. No cranial nerve deficit or sensory deficit.  Skin: Skin is warm, dry and intact. No rash noted.  Psychiatric: She has a normal mood and affect. Her speech is normal and behavior is normal. Thought content normal.  Nursing note and vitals reviewed.   BP 124/81 (BP Location: Left Arm, Patient Position: Sitting, Cuff Size: Normal)   Pulse 74   Resp 16   Ht 5\' 2"  (1.575 m)   Wt 144 lb 12.8 oz (65.7 kg)   LMP 12/10/2015 (Exact Date)   SpO2 99%   BMI 26.48 kg/m   Assessment and Plan: 1. Annual physical exam Normal exam - CBC with Differential/Platelet - Comprehensive metabolic panel - POCT urinalysis dipstick - Lipid panel - TSH  2. Pap smear for cervical cancer screening - Pap IG and HPV (high risk) DNA detection  3. Colon cancer screening - Ambulatory referral to Gastroenterology  4. Nonintractable migraine, unspecified migraine type Refill imitrex - SUMAtriptan (IMITREX) 100 MG tablet; Take 1 tablet (100 mg total) by mouth every 2 (two) hours as needed for migraine. May repeat in 2 hours if headache persists or recurs.  Dispense: 10 tablet; Refill: 0  5. Encounter for screening for HIV - HIV antibody   Caitlin Maidens, MD [facility]  12/31/2015

## 2015-12-31 NOTE — Patient Instructions (Signed)
Breast Self-Awareness Practicing breast self-awareness may pick up problems early, prevent significant medical complications, and possibly save your life. By practicing breast self-awareness, you can become familiar with how your breasts look and feel and if your breasts are changing. This allows you to notice changes early. It can also offer you some reassurance that your breast health is good. One way to learn what is normal for your breasts and whether your breasts are changing is to do a breast self-exam. If you find a lump or something that was not present in the past, it is best to contact your caregiver right away. Other findings that should be evaluated by your caregiver include nipple discharge, especially if it is bloody; skin changes or reddening; areas where the skin seems to be pulled in (retracted); or new lumps and bumps. Breast pain is seldom associated with cancer (malignancy), but should also be evaluated by a caregiver. HOW TO PERFORM A BREAST SELF-EXAM The best time to examine your breasts is 5-7 days after your menstrual period is over. During menstruation, the breasts are lumpier, and it may be more difficult to pick up changes. If you do not menstruate, have reached menopause, or had your uterus removed (hysterectomy), you should examine your breasts at regular intervals, such as monthly. If you are breastfeeding, examine your breasts after a feeding or after using a breast pump. Breast implants do not decrease the risk for lumps or tumors, so continue to perform breast self-exams as recommended. Talk to your caregiver about how to determine the difference between the implant and breast tissue. Also, talk about the amount of pressure you should use during the exam. Over time, you will become more familiar with the variations of your breasts and more comfortable with the exam. A breast self-exam requires you to remove all your clothes above the waist. 1. Look at your breasts and nipples.  Stand in front of a mirror in a room with good lighting. With your hands on your hips, push your hands firmly downward. Look for a difference in shape, contour, and size from one breast to the other (asymmetry). Asymmetry includes puckers, dips, or bumps. Also, look for skin changes, such as reddened or scaly areas on the breasts. Look for nipple changes, such as discharge, dimpling, repositioning, or redness. 2. Carefully feel your breasts. This is best done either in the shower or tub while using soapy water or when flat on your back. Place the arm (on the side of the breast you are examining) above your head. Use the pads (not the fingertips) of your three middle fingers on your opposite hand to feel your breasts. Start in the underarm area and use  inch (2 cm) overlapping circles to feel your breast. Use 3 different levels of pressure (light, medium, and firm pressure) at each circle before moving to the next circle. The light pressure is needed to feel the tissue closest to the skin. The medium pressure will help to feel breast tissue a little deeper, while the firm pressure is needed to feel the tissue close to the ribs. Continue the overlapping circles, moving downward over the breast until you feel your ribs below your breast. Then, move one finger-width towards the center of the body. Continue to use the  inch (2 cm) overlapping circles to feel your breast as you move slowly up toward the collar bone (clavicle) near the base of the neck. Continue the up and down exam using all 3 pressures until you reach the   middle of the chest. Do this with each breast, carefully feeling for lumps or changes. 3.  Keep a written record with breast changes or normal findings for each breast. By writing this information down, you do not need to depend only on memory for size, tenderness, or location. Write down where you are in your menstrual cycle, if you are still menstruating. Breast tissue can have some lumps or  thick tissue. However, see your caregiver if you find anything that concerns you.  SEEK MEDICAL CARE IF:  You see a change in shape, contour, or size of your breasts or nipples.   You see skin changes, such as reddened or scaly areas on the breasts or nipples.   You have an unusual discharge from your nipples.   You feel a new lump or unusually thick areas.    This information is not intended to replace advice given to you by your health care provider. Make sure you discuss any questions you have with your health care provider.   Document Released: 05/05/2005 Document Revised: 04/21/2012 Document Reviewed: 08/20/2011 Elsevier Interactive Patient Education 2016 Elsevier Inc.  

## 2016-01-01 LAB — CBC WITH DIFFERENTIAL/PLATELET
BASOS: 1 %
Basophils Absolute: 0 10*3/uL (ref 0.0–0.2)
EOS (ABSOLUTE): 0.1 10*3/uL (ref 0.0–0.4)
Eos: 1 %
HEMOGLOBIN: 14.4 g/dL (ref 11.1–15.9)
Hematocrit: 43.6 % (ref 34.0–46.6)
IMMATURE GRANS (ABS): 0 10*3/uL (ref 0.0–0.1)
Immature Granulocytes: 0 %
LYMPHS: 28 %
Lymphocytes Absolute: 1.8 10*3/uL (ref 0.7–3.1)
MCH: 29 pg (ref 26.6–33.0)
MCHC: 33 g/dL (ref 31.5–35.7)
MCV: 88 fL (ref 79–97)
MONOCYTES: 5 %
Monocytes Absolute: 0.3 10*3/uL (ref 0.1–0.9)
NEUTROS ABS: 4 10*3/uL (ref 1.4–7.0)
Neutrophils: 65 %
Platelets: 296 10*3/uL (ref 150–379)
RBC: 4.96 x10E6/uL (ref 3.77–5.28)
RDW: 13.5 % (ref 12.3–15.4)
WBC: 6.2 10*3/uL (ref 3.4–10.8)

## 2016-01-01 LAB — LIPID PANEL
CHOL/HDL RATIO: 4.5 ratio — AB (ref 0.0–4.4)
Cholesterol, Total: 227 mg/dL — ABNORMAL HIGH (ref 100–199)
HDL: 50 mg/dL (ref >39)
LDL Calculated: 151 mg/dL — ABNORMAL HIGH (ref 0–99)
Triglycerides: 132 mg/dL (ref 0–149)
VLDL CHOLESTEROL CAL: 26 mg/dL (ref 5–40)

## 2016-01-01 LAB — COMPREHENSIVE METABOLIC PANEL
A/G RATIO: 1.8 (ref 1.2–2.2)
ALT: 26 IU/L (ref 0–32)
AST: 27 IU/L (ref 0–40)
Albumin: 4.9 g/dL (ref 3.5–5.5)
Alkaline Phosphatase: 74 IU/L (ref 39–117)
BILIRUBIN TOTAL: 0.4 mg/dL (ref 0.0–1.2)
BUN/Creatinine Ratio: 23 (ref 9–23)
BUN: 18 mg/dL (ref 6–24)
CHLORIDE: 98 mmol/L (ref 96–106)
CO2: 25 mmol/L (ref 18–29)
Calcium: 9.9 mg/dL (ref 8.7–10.2)
Creatinine, Ser: 0.8 mg/dL (ref 0.57–1.00)
GFR calc non Af Amer: 86 mL/min/{1.73_m2} (ref >59)
GFR, EST AFRICAN AMERICAN: 99 mL/min/{1.73_m2} (ref >59)
GLUCOSE: 89 mg/dL (ref 65–99)
Globulin, Total: 2.7 g/dL (ref 1.5–4.5)
Potassium: 5.3 mmol/L — ABNORMAL HIGH (ref 3.5–5.2)
Sodium: 139 mmol/L (ref 134–144)
TOTAL PROTEIN: 7.6 g/dL (ref 6.0–8.5)

## 2016-01-01 LAB — TSH: TSH: 2.54 u[IU]/mL (ref 0.450–4.500)

## 2016-01-01 LAB — HIV ANTIBODY (ROUTINE TESTING W REFLEX): HIV SCREEN 4TH GENERATION: NONREACTIVE

## 2016-01-02 LAB — PAP IG AND HPV HIGH-RISK
HPV, HIGH-RISK: NEGATIVE
PAP Smear Comment: 0

## 2016-01-09 ENCOUNTER — Other Ambulatory Visit: Payer: Self-pay

## 2016-01-09 ENCOUNTER — Telehealth: Payer: Self-pay

## 2016-01-09 NOTE — Telephone Encounter (Signed)
Screening Colonoscopy Z12.11 [facility] [id number] Please pre cert

## 2016-01-09 NOTE — Telephone Encounter (Signed)
Gastroenterology Pre-Procedure Review  Request Date: 02/08/2016 Requesting Physician: Dr. Army Melia  PATIENT REVIEW QUESTIONS: The patient responded to the following health history questions as indicated:    1. Are you having any GI issues? no 2. Do you have a personal history of Polyps? no 3. Do you have a family history of Colon Cancer or Polyps? no 4. Diabetes Mellitus? no 5. Joint replacements in the past 12 months?no 6. Major health problems in the past 3 months?no 7. Any artificial heart valves, MVP, or defibrillator?no    MEDICATIONS & ALLERGIES:    Patient reports the following regarding taking any anticoagulation/antiplatelet therapy:   Plavix, Coumadin, Eliquis, Xarelto, Lovenox, Pradaxa, Brilinta, or Effient? no Aspirin? no  Patient confirms/reports the following medications:  Current Outpatient Prescriptions  Medication Sig Dispense Refill  . OnabotulinumtoxinA (BOTOX IJ) Inject as directed. Administered ~ every 5 months for migraine prevention by practitioner in [place]    . PHENTERMINE HCL PO Take 1 tablet by mouth daily.    . SUMAtriptan (IMITREX) 100 MG tablet Take 1 tablet (100 mg total) by mouth every 2 (two) hours as needed for migraine. May repeat in 2 hours if headache persists or recurs. 10 tablet 0   No current facility-administered medications for this visit.     Patient confirms/reports the following allergies:  No Known Allergies  No orders of the defined types were placed in this encounter.   AUTHORIZATION INFORMATION Primary Insurance: 1D#: Group #:  Secondary Insurance: 1D#: Group #:  SCHEDULE INFORMATION: Date: 02/08/2016 Time: Location: [facility]

## 2016-02-04 ENCOUNTER — Encounter: Payer: Self-pay | Admitting: *Deleted

## 2016-02-08 ENCOUNTER — Encounter: Payer: Self-pay | Admitting: Anesthesiology

## 2016-02-08 ENCOUNTER — Ambulatory Visit: Payer: Managed Care, Other (non HMO) | Admitting: Anesthesiology

## 2016-02-08 ENCOUNTER — Ambulatory Visit
Admission: RE | Admit: 2016-02-08 | Discharge: 2016-02-08 | Disposition: A | Discharge status: Home / Self Care | Payer: Managed Care, Other (non HMO) | Source: Ambulatory Visit | Attending: Gastroenterology | Admitting: Gastroenterology

## 2016-02-08 ENCOUNTER — Encounter
Admission: RE | Disposition: A | Discharge status: Home / Self Care | Payer: Self-pay | Source: Ambulatory Visit | Attending: Gastroenterology

## 2016-02-08 DIAGNOSIS — G43909 Migraine, unspecified, not intractable, without status migrainosus: Secondary | ICD-10-CM | POA: Insufficient documentation

## 2016-02-08 DIAGNOSIS — K5730 Diverticulosis of large intestine without perforation or abscess without bleeding: Secondary | ICD-10-CM | POA: Insufficient documentation

## 2016-02-08 DIAGNOSIS — Z79899 Other long term (current) drug therapy: Secondary | ICD-10-CM | POA: Diagnosis not present

## 2016-02-08 DIAGNOSIS — R42 Dizziness and giddiness: Secondary | ICD-10-CM | POA: Diagnosis not present

## 2016-02-08 DIAGNOSIS — Z1211 Encounter for screening for malignant neoplasm of colon: Secondary | ICD-10-CM

## 2016-02-08 DIAGNOSIS — D120 Benign neoplasm of cecum: Secondary | ICD-10-CM

## 2016-02-08 HISTORY — DX: Encounter for adoption services: Z02.82

## 2016-02-08 HISTORY — PX: COLONOSCOPY WITH PROPOFOL: SHX5780

## 2016-02-08 HISTORY — DX: Motion sickness, initial encounter: T75.3XXA

## 2016-02-08 HISTORY — PX: POLYPECTOMY: SHX5525

## 2016-02-08 HISTORY — DX: Dizziness and giddiness: R42

## 2016-02-08 HISTORY — DX: Other specified health status: Z78.9

## 2016-02-08 SURGERY — COLONOSCOPY WITH PROPOFOL
Anesthesia: Monitor Anesthesia Care | Wound class: Contaminated

## 2016-02-08 MED ORDER — LACTATED RINGERS IV SOLN
INTRAVENOUS | Status: DC
  Administered 2016-02-08: 10:00:00 via INTRAVENOUS

## 2016-02-08 MED ORDER — SIMETHICONE 40 MG/0.6ML PO SUSP
ORAL | Status: DC | PRN
  Administered 2016-02-08: 11:00:00

## 2016-02-08 MED ORDER — LIDOCAINE HCL (CARDIAC) 20 MG/ML IV SOLN: INTRAVENOUS | Status: DC | PRN

## 2016-02-08 MED ORDER — LIDOCAINE HCL (CARDIAC) 20 MG/ML IV SOLN
INTRAVENOUS | Status: DC | PRN
  Administered 2016-02-08: 30 mg via INTRAVENOUS

## 2016-02-08 MED ORDER — PROPOFOL 10 MG/ML IV BOLUS
INTRAVENOUS | Status: DC | PRN
  Administered 2016-02-08 (×4): 50 mg via INTRAVENOUS

## 2016-02-08 SURGICAL SUPPLY — 23 items
CANISTER SUCT 1200ML W/VALVE (MISCELLANEOUS) ×4 IMPLANT
CLIP HMST 235XBRD CATH ROT (MISCELLANEOUS) IMPLANT
CLIP RESOLUTION 360 11X235 (MISCELLANEOUS)
FCP ESCP3.2XJMB 240X2.8X (MISCELLANEOUS)
FORCEPS BIOP RAD 4 LRG CAP 4 (CUTTING FORCEPS) IMPLANT
FORCEPS BIOP RJ4 240 W/NDL (MISCELLANEOUS)
FORCEPS ESCP3.2XJMB 240X2.8X (MISCELLANEOUS) IMPLANT
GOWN CVR UNV OPN BCK APRN NK (MISCELLANEOUS) ×4 IMPLANT
GOWN ISOL THUMB LOOP REG UNIV (MISCELLANEOUS) ×8
INJECTOR VARIJECT VIN23 (MISCELLANEOUS) IMPLANT
KIT DEFENDO VALVE AND CONN (KITS) IMPLANT
KIT ENDO PROCEDURE OLY (KITS) ×4 IMPLANT
MARKER SPOT ENDO TATTOO 5ML (MISCELLANEOUS) IMPLANT
PAD GROUND ADULT SPLIT (MISCELLANEOUS) IMPLANT
PROBE APC STR FIRE (PROBE) IMPLANT
RETRIEVER NET ROTH 2.5X230 LF (MISCELLANEOUS) IMPLANT
SNARE SHORT THROW 13M SML OVAL (MISCELLANEOUS) IMPLANT
SNARE SHORT THROW 30M LRG OVAL (MISCELLANEOUS) IMPLANT
SNARE SNG USE RND 15MM (INSTRUMENTS) IMPLANT
SPOT EX ENDOSCOPIC TATTOO (MISCELLANEOUS)
TRAP ETRAP POLY (MISCELLANEOUS) IMPLANT
VARIJECT INJECTOR VIN23 (MISCELLANEOUS)
WATER STERILE IRR 250ML POUR (IV SOLUTION) ×4 IMPLANT

## 2016-02-08 NOTE — H&P (Signed)
  Lucilla Lame, MD [address] [place], [place] [address] Phone: [phone] Fax : [phone]  Primary Care Physician:  [name], MD Primary Gastroenterologist:  Dr. Allen Norris  Pre-Procedure History & Physical: HPI:  Caitlin Ware is a 50 y.o. female is here for a screening colonoscopy.   Past Medical History:  Diagnosis Date  . Adopted   . Eczema   . Headache    migraines  . Motion sickness    cars  . Vertigo     Past Surgical History:  Procedure Laterality Date  . ANKLE SURGERY Right 2006  . AUGMENTATION MAMMAPLASTY  2010  . SHOULDER SURGERY Right 2000    Prior to Admission medications   Medication Sig Start Date End Date Taking? Authorizing Provider  meclizine (ANTIVERT) 25 MG tablet Take 25 mg by mouth 3 (three) times daily as needed for dizziness.   Yes Historical Provider, MD  Multiple Vitamins-Minerals (THRIVE [employer] WOMENS PO) Take by mouth.   Yes Historical Provider, MD  OnabotulinumtoxinA (BOTOX IJ) Inject as directed. Administered ~ every 5 months for migraine prevention by practitioner in [facility]   Yes Historical Provider, MD  PHENTERMINE HCL PO Take 1 tablet by mouth daily.   Yes Historical Provider, MD  SUMAtriptan (IMITREX) 100 MG tablet Take 1 tablet (100 mg total) by mouth every 2 (two) hours as needed for migraine. May repeat in 2 hours if headache persists or recurs. 12/31/15  Yes Glean Hess, MD    Allergies as of 01/09/2016  . (No Known Allergies)    Family History  Problem Relation Age of Onset  . Adopted: Yes  . Family history unknown: Yes    Social History   Social History  . Marital status: Married    Spouse name: N/A  . Number of children: N/A  . Years of education: N/A   Occupational History  . Not on file.   Social History Main Topics  . Smoking status: Never Smoker  . Smokeless tobacco: Never Used  . Alcohol use 4.2 oz/week    7 Glasses of wine per week  . Drug use: No  . Sexual activity: Yes   Birth control/ protection: None   Other Topics Concern  . Not on file   Social History Narrative  . No narrative on file    Review of Systems: See HPI, otherwise negative ROS  Physical Exam: BP (!) 146/66   Pulse 95   Temp 97.7 F (36.5 C) (Temporal)   Resp 16   Ht 5\' 2"  (1.575 m)   Wt 140 lb (63.5 kg)   LMP 01/25/2016 (Exact Date)   SpO2 100%   BMI 25.61 kg/m  General:   Alert,  pleasant and cooperative in NAD Head:  Normocephalic and atraumatic. Neck:  Supple; no masses or thyromegaly. Lungs:  Clear throughout to auscultation.    Heart:  Regular rate and rhythm. Abdomen:  Soft, nontender and nondistended. Normal bowel sounds, without guarding, and without rebound.   Neurologic:  Alert and  oriented x4;  grossly normal neurologically.  Impression/Plan: Caitlin Ware is now here to undergo a screening colonoscopy.  Risks, benefits, and alternatives regarding colonoscopy have been reviewed with the patient.  Questions have been answered.  All parties agreeable.

## 2016-02-08 NOTE — Op Note (Signed)
[facility] Gastroenterology Patient Name: Caitlin Ware Procedure Date: 02/08/2016 10:24 AM MRN: [id number] Account #: [id number] Date of Birth: 1966-04-26 Admit Type: Outpatient Age: 50 Room: [facility] OR ROOM 01 Gender: Female Note Status: Finalized Procedure:            Colonoscopy Indications:          Screening for colorectal malignant neoplasm Providers:            Lucilla Lame MD, MD Referring MD:         [name], MD (Referring MD) Medicines:            Propofol per Anesthesia Complications:        No immediate complications. Procedure:            Pre-Anesthesia Assessment:                       - Prior to the procedure, a History and Physical was                        performed, and patient medications and allergies were                        reviewed. The patient's tolerance of previous                        anesthesia was also reviewed. The risks and benefits of                        the procedure and the sedation options and risks were                        discussed with the patient. All questions were                        answered, and informed consent was obtained. Prior                        Anticoagulants: The patient has taken no previous                        anticoagulant or antiplatelet agents. ASA Grade                        Assessment: II - A patient with mild systemic disease.                        After reviewing the risks and benefits, the patient was                        deemed in satisfactory condition to undergo the                        procedure.                       After obtaining informed consent, the colonoscope was                        passed under direct vision. Throughout the procedure,  the patient's blood pressure, pulse, and oxygen                        saturations were monitored continuously. The Olympus CF                        H180AL colonoscope (S#: [id number]) was introduced  through                        the anus and advanced to the the cecum, identified by                        appendiceal orifice and ileocecal valve. The                        colonoscopy was performed without difficulty. The                        patient tolerated the procedure well. The quality of                        the bowel preparation was excellent. Findings:      The perianal and digital rectal examinations were normal.      A 2 mm polyp was found in the cecum. The polyp was sessile. The polyp       was removed with a cold biopsy forceps. Resection and retrieval were       complete.      A few small-mouthed diverticula were found in the sigmoid colon. Impression:           - One 2 mm polyp in the cecum, removed with a cold                        biopsy forceps. Resected and retrieved.                       - Diverticulosis in the sigmoid colon. Recommendation:       - Discharge patient to home.                       - Resume previous diet.                       - Continue present medications.                       - Await pathology results. Procedure Code(s):    --- Professional ---                       [phone], Colonoscopy, flexible; with biopsy, single or                        multiple Diagnosis Code(s):    --- Professional ---                       Z12.11, Encounter for screening for malignant neoplasm                        of colon  D12.0, Benign neoplasm of cecum CPT copyright 2016 American Medical Association. All rights reserved. The codes documented in this report are preliminary and upon coder review may  be revised to meet current compliance requirements. Lucilla Lame MD, MD 02/08/2016 10:45:45 AM This report has been signed electronically. Number of Addenda: 0 Note Initiated On: 02/08/2016 10:24 AM Scope Withdrawal Time: 0 hours 7 minutes 14 seconds  Total Procedure Duration: 0 hours 11 minutes 34 seconds       [facility]

## 2016-02-08 NOTE — Discharge Instructions (Signed)

## 2016-02-08 NOTE — Anesthesia Postprocedure Evaluation (Signed)
Anesthesia Post Note  Patient: Caitlin Ware  Procedure(s) Performed: Procedure(s) (LRB): COLONOSCOPY WITH PROPOFOL (N/A) POLYPECTOMY  Patient location during evaluation: PACU Anesthesia Type: MAC Level of consciousness: awake and alert Pain management: pain level controlled Vital Signs Assessment: post-procedure vital signs reviewed and stable Respiratory status: spontaneous breathing, nonlabored ventilation, respiratory function stable and patient connected to nasal cannula oxygen Cardiovascular status: stable and blood pressure returned to baseline Anesthetic complications: no    Kaylem Gidney

## 2016-02-08 NOTE — Transfer of Care (Signed)
Immediate Anesthesia Transfer of Care Note  Patient: Caitlin Ware  Procedure(s) Performed: Procedure(s): COLONOSCOPY WITH PROPOFOL (N/A) POLYPECTOMY  Patient Location: PACU  Anesthesia Type: MAC  Level of Consciousness: awake, alert  and patient cooperative  Airway and Oxygen Therapy: Patient Spontanous Breathing and Patient connected to supplemental oxygen  Post-op Assessment: Post-op Vital signs reviewed, Patient's Cardiovascular Status Stable, Respiratory Function Stable, Patent Airway and No signs of Nausea or vomiting  Post-op Vital Signs: Reviewed and stable  Complications: No apparent anesthesia complications

## 2016-02-08 NOTE — Anesthesia Preprocedure Evaluation (Signed)
Anesthesia Evaluation  Patient identified by MRN, date of birth, ID band  Reviewed: NPO status   History of Anesthesia Complications Negative for: history of anesthetic complications  Airway Mallampati: II  TM Distance: >3 FB Neck ROM: full    Dental no notable dental hx.    Pulmonary neg pulmonary ROS,    Pulmonary exam normal        Cardiovascular Exercise Tolerance: Good negative cardio ROS Normal cardiovascular exam     Neuro/Psych  Headaches, negative psych ROS   GI/Hepatic Neg liver ROS, GERD  Controlled,  Endo/Other  negative endocrine ROS  Renal/GU negative Renal ROS  negative genitourinary   Musculoskeletal   Abdominal   Peds  Hematology negative hematology ROS (+)   Anesthesia Other Findings Pt Adopted.  Pt unable to give urine sample for HCG. LMP 2-3 weeks ago.  Pt understands R/B/A.  Reproductive/Obstetrics                             Anesthesia Physical Anesthesia Plan  ASA: II  Anesthesia Plan: MAC   Post-op Pain Management:    Induction:   Airway Management Planned:   Additional Equipment:   Intra-op Plan:   Post-operative Plan:   Informed Consent: I have reviewed the patients History and Physical, chart, labs and discussed the procedure including the risks, benefits and alternatives for the proposed anesthesia with the patient or authorized representative who has indicated his/her understanding and acceptance.     Plan Discussed with: CRNA  Anesthesia Plan Comments:         Anesthesia Quick Evaluation

## 2016-02-11 ENCOUNTER — Encounter: Payer: Self-pay | Admitting: Gastroenterology

## 2016-02-13 ENCOUNTER — Encounter: Payer: Self-pay | Admitting: Gastroenterology

## 2016-04-21 ENCOUNTER — Ambulatory Visit
Admission: EM | Admit: 2016-04-21 | Discharge: 2016-04-21 | Disposition: A | Discharge status: Home / Self Care | Payer: Managed Care, Other (non HMO) | Attending: Family Medicine | Admitting: Family Medicine

## 2016-04-21 DIAGNOSIS — N760 Acute vaginitis: Secondary | ICD-10-CM

## 2016-04-21 DIAGNOSIS — J0100 Acute maxillary sinusitis, unspecified: Secondary | ICD-10-CM | POA: Diagnosis not present

## 2016-04-21 MED ORDER — FLUCONAZOLE 150 MG PO TABS: 150.0000 mg | ORAL_TABLET | Freq: Every day | ORAL | 0 refills | Status: DC

## 2016-04-21 MED ORDER — AMOXICILLIN-POT CLAVULANATE 875-125 MG PO TABS: 1.0000 | ORAL_TABLET | Freq: Two times a day (BID) | ORAL | 0 refills | Status: DC

## 2016-04-21 NOTE — Discharge Instructions (Signed)
Take medication as prescribed. Rest. Drink plenty of fluids.  ° °Follow up with your primary care physician this week as needed. Return to Urgent care for new or worsening concerns.  ° °

## 2016-04-21 NOTE — ED Provider Notes (Signed)
[facility] ____________________________________________  Time seen: Approximately 6:26 PM  I have reviewed the triage vital signs and the nursing notes.   HISTORY  Chief Complaint Sinus Problem and Vaginal Itching  HPI Caitlin Ware is a 50 y.o. female presents with months of runny nose, nasal congestion, sinus pressure and sinus drainage for the last 8-9 days. Patient reports tenderness in sinuses around bilateral cheeks. Patient reports unresolved with over-the-counter Mucinex and use of her netti pot. Patient also complains of vaginitis. Patient states she feels that she has a yeast infection that is not resolving with a dose of Monistat. Patient states some whitish vaginal discharge, vaginal irritation and itching. Denies fevers, urinary frequency, urinary urgency or burning with urination. Denies concerns of STDs. States sexually active with one partner her husband. Denies vaginal pain or pelvic pain. Denies abdominal pain. Denies chest pain, shortness of breath, back pain, weakness or other complaints.  Caitlin Maidens, MD: PCP Patient's last menstrual period was 04/02/2016. Denies pregnancy.   Past Medical History:  Diagnosis Date  . Adopted   . Eczema   . Headache    migraines  . Motion sickness    cars  . Vertigo     Patient Active Problem List   Diagnosis Date Noted  . Special screening for malignant neoplasms, colon   . Benign neoplasm of cecum   . Migraine headache 09/11/2015  . GERD (gastroesophageal reflux disease) 09/11/2015  . Hyperlipidemia, mixed 09/11/2015  . Hx of abnormal cervical Papanicolaou smear 09/11/2015  . Vertigo 09/11/2015  . Vitamin D deficiency 09/11/2015    Past Surgical History:  Procedure Laterality Date  . ANKLE SURGERY Right 2006  . AUGMENTATION MAMMAPLASTY  2010  . COLONOSCOPY WITH PROPOFOL N/A 02/08/2016   Procedure: COLONOSCOPY WITH PROPOFOL;  Surgeon: Lucilla Lame, MD;  Location: [place];  Service:  Endoscopy;  Laterality: N/A;  . POLYPECTOMY  02/08/2016   Procedure: POLYPECTOMY;  Surgeon: Lucilla Lame, MD;  Location: [place];  Service: Endoscopy;;  . SHOULDER SURGERY Right 2000   No current facility-administered medications for this encounter.   Current Outpatient Prescriptions:  Marland Kitchen  Multiple Vitamins-Minerals (THRIVE FOR LIFE WOMENS PO), Take by mouth., Disp: , Rfl:  .  omeprazole (PRILOSEC) 20 MG capsule, Take 20 mg by mouth daily., Disp: , Rfl:  .  OnabotulinumtoxinA (BOTOX IJ), Inject as directed. Administered ~ every 5 months for migraine prevention by practitioner in [place], [place]: , Rfl:  .  SUMAtriptan (IMITREX) 100 MG tablet, Take 1 tablet (100 mg total) by mouth every 2 (two) hours as needed for migraine. May repeat in 2 hours if headache persists or recurs., Disp: 10 tablet, Rfl: 0 .  amoxicillin-clavulanate (AUGMENTIN) 875-125 MG tablet, Take 1 tablet by mouth every 12 (twelve) hours., Disp: 20 tablet, Rfl: 0 .  fluconazole (DIFLUCAN) 150 MG tablet, Take 1 tablet (150 mg total) by mouth daily. Take one pill orally, then Repeat in 72 hrs as needed., Disp: 2 tablet, Rfl: 0 .  meclizine (ANTIVERT) 25 MG tablet, Take 25 mg by mouth 3 (three) times daily as needed for dizziness., Disp: , Rfl:  .  PHENTERMINE HCL PO, Take 1 tablet by mouth daily., Disp: , Rfl:   Allergies Patient has no known allergies.  Family History  Problem Relation Age of Onset  . Adopted: Yes  . Family history unknown: Yes    Social History Social History  Substance Use Topics  . Smoking status: Never Smoker  . Smokeless tobacco: Never  Used  . Alcohol use 4.2 oz/week    7 Glasses of wine per week    Review of Systems Constitutional: No fever/chills Eyes: No visual changes. ENT: No sore throat.As above. Cardiovascular: Denies chest pain. Respiratory: Denies shortness of breath. Gastrointestinal: No abdominal pain.  No nausea, no vomiting.  No diarrhea.  No  constipation. Genitourinary: Negative for dysuria. As above. Musculoskeletal: Negative for back pain. Skin: Negative for rash. Neurological: Negative for headaches, focal weakness or numbness.  10-point ROS otherwise negative.  ____________________________________________   PHYSICAL EXAM:  VITAL SIGNS: ED Triage Vitals  Enc Vitals Group     BP 04/21/16 1753 125/81     Pulse Rate 04/21/16 1753 87     Resp 04/21/16 1753 17     Temp 04/21/16 1753 98.4 F (36.9 C)     Temp Source 04/21/16 1753 Oral     SpO2 04/21/16 1753 99 %     Weight 04/21/16 1755 140 lb (63.5 kg)     Height 04/21/16 1755 5\' 2"  (1.575 m)     Head Circumference --      Peak Flow --      Pain Score 04/21/16 1758 3     Pain Loc --      Pain Edu? --      Excl. in [place]? --     Constitutional: Alert and oriented. Well appearing and in no acute distress. Eyes: Conjunctivae are normal. PERRL. EOMI. Head: Atraumatic.Mild to moderate tenderness to palpation bilateral  maxillary sinuses. No frontal sinus tenderness to palpation. No swelling. No erythema.   Ears: no erythema, normal TMs bilaterally.   Nose: nasal congestion with bilateral nasal turbinate erythema and edema.   Mouth/Throat: Mucous membranes are moist.  Oropharynx non-erythematous.No tonsillar swelling or exudate.  Neck: No stridor.  No cervical spine tenderness to palpation. Hematological/Lymphatic/Immunilogical: No cervical lymphadenopathy. Cardiovascular: Normal rate, regular rhythm. Grossly normal heart sounds.  Good peripheral circulation. Respiratory: Normal respiratory effort.  No retractions. Lungs CTAB. No wheezes, rales or rhonchi. Good air movement.  Gastrointestinal: Soft and nontender. No distention. No CVA tenderness. Pelvic: Patient declined. Musculoskeletal: Ambulatory with steady gait. Neurologic:  Normal speech and language. No gross focal neurologic deficits are appreciated. No gait instability. Skin:  Skin is warm, dry and intact. No  rash noted. Psychiatric: Mood and affect are normal. Speech and behavior are normal.  ___________________________________________   LABS (all labs ordered are listed, but only abnormal results are displayed)  Labs Reviewed - No data to display _  PROCEDURES Procedures     INITIAL IMPRESSION / [place] / ED COURSE  Pertinent labs & imaging results that were available during my care of the patient were reviewed by me and considered in my medical decision making (see chart for details).  Well-appearing patient. No acute distress. Suspect maxillary sinusitis. Will treat with oral Augmentin and supportive care. Patient also reports concerns of yeast vaginitis. Unresolved with dose of over-the-counter Monistat. Discussed with patient, patient declines pelvic exam at this time, will treat patient with oral Diflucan and patient states the symptoms do not improve and resolve will follow up and have pelvic exam completed at that time.Discussed indication, risks and benefits of medications with patient.  Discussed follow up with Primary care physician this week. Discussed follow up and return parameters including no resolution or any worsening concerns. Patient verbalized understanding and agreed to plan.   ____________________________________________   FINAL CLINICAL IMPRESSION(S) / ED DIAGNOSES  Final diagnoses:  Acute maxillary sinusitis,  recurrence not specified  Acute vaginitis     Discharge Medication List as of 04/21/2016  6:27 PM    START taking these medications   Details  amoxicillin-clavulanate (AUGMENTIN) 875-125 MG tablet Take 1 tablet by mouth every 12 (twelve) hours., Starting Mon 04/21/2016, Normal    fluconazole (DIFLUCAN) 150 MG tablet Take 1 tablet (150 mg total) by mouth daily. Take one pill orally, then Repeat in 72 hrs as needed., Starting Mon 04/21/2016, Normal        Note: This dictation was prepared with Dragon dictation along with smaller phrase  technology. Any transcriptional errors that result from this process are unintentional.    Clinical Course       Marylene Land, NP 04/21/16 1943

## 2016-04-21 NOTE — ED Triage Notes (Signed)
Patient complains of sinus pain and pressure x 8 days . Patient also reports that she has a possible yeast infection. Patient states that she tried to use monistat 3 day treatment but has seen no relief. Patient reports that yeast symptoms started on Friday with itching and burning and change in discharge.

## 2016-04-22 ENCOUNTER — Ambulatory Visit: Payer: Managed Care, Other (non HMO) | Admitting: Internal Medicine

## 2016-04-24 ENCOUNTER — Telehealth: Payer: Self-pay

## 2016-04-24 NOTE — Telephone Encounter (Signed)
Courtesy call back completed today after patients visit at [facility]. Patient improved and will follow up with their PCP if symptoms continue or worsen.  She is having some stomach issues with the Amoxicillin I encouraged her to take it on a full stomach.

## 2017-03-05 ENCOUNTER — Other Ambulatory Visit: Payer: Self-pay | Admitting: Obstetrics & Gynecology

## 2017-03-05 DIAGNOSIS — Z1231 Encounter for screening mammogram for malignant neoplasm of breast: Secondary | ICD-10-CM

## 2017-03-27 ENCOUNTER — Ambulatory Visit
Admission: RE | Admit: 2017-03-27 | Discharge: 2017-03-27 | Disposition: A | Discharge status: Home / Self Care | Payer: Commercial Managed Care - PPO | Source: Ambulatory Visit | Attending: Obstetrics & Gynecology | Admitting: Obstetrics & Gynecology

## 2017-03-27 ENCOUNTER — Other Ambulatory Visit: Payer: Self-pay | Admitting: Obstetrics & Gynecology

## 2017-03-27 DIAGNOSIS — Z1231 Encounter for screening mammogram for malignant neoplasm of breast: Secondary | ICD-10-CM

## 2017-05-08 ENCOUNTER — Ambulatory Visit
Admission: EM | Admit: 2017-05-08 | Discharge: 2017-05-08 | Disposition: A | Discharge status: Home / Self Care | Payer: Commercial Managed Care - PPO | Attending: Physician Assistant | Admitting: Physician Assistant

## 2017-05-08 ENCOUNTER — Encounter: Payer: Self-pay | Admitting: Emergency Medicine

## 2017-05-08 ENCOUNTER — Ambulatory Visit (INDEPENDENT_AMBULATORY_CARE_PROVIDER_SITE_OTHER): Payer: Commercial Managed Care - PPO

## 2017-05-08 ENCOUNTER — Other Ambulatory Visit: Payer: Self-pay

## 2017-05-08 DIAGNOSIS — R05 Cough: Secondary | ICD-10-CM

## 2017-05-08 DIAGNOSIS — J069 Acute upper respiratory infection, unspecified: Secondary | ICD-10-CM

## 2017-05-08 DIAGNOSIS — H6693 Otitis media, unspecified, bilateral: Secondary | ICD-10-CM

## 2017-05-08 DIAGNOSIS — R059 Cough, unspecified: Secondary | ICD-10-CM

## 2017-05-08 MED ORDER — FLUCONAZOLE 150 MG PO TABS: 150.0000 mg | ORAL_TABLET | Freq: Every day | ORAL | 1 refills | Status: DC

## 2017-05-08 MED ORDER — AMOXICILLIN-POT CLAVULANATE 875-125 MG PO TABS: 1.0000 | ORAL_TABLET | Freq: Two times a day (BID) | ORAL | 0 refills | Status: DC

## 2017-05-08 MED ORDER — BENZONATATE 100 MG PO CAPS: 100.0000 mg | ORAL_CAPSULE | Freq: Three times a day (TID) | ORAL | 0 refills | Status: DC

## 2017-05-08 NOTE — ED Provider Notes (Signed)
[facility]    CSN: [id number] Arrival date & time: 05/08/17  1641     History   Chief Complaint Chief Complaint  Patient presents with  . Otalgia  . Cough    HPI Caitlin Ware is a 51 y.o. female.   Patient is a 51 year female who presents with complaint of 10 day history of cough that is getting worse. She reports pain to both ears, left worse than the right. She also reports runny nose but denies sore throat. States her cough is a wet cough but has been unable to actually cough up anything. She denies chest pain and shortness of breath. She has taken some Delsym cough medication at night to help with sleep. She reports no abdominal pain no nausea no vomiting.      Past Medical History:  Diagnosis Date  . Adopted   . Eczema   . Headache    migraines  . Motion sickness    cars  . Vertigo     Patient Active Problem List   Diagnosis Date Noted  . Special screening for malignant neoplasms, colon   . Benign neoplasm of cecum   . Migraine headache 09/11/2015  . GERD (gastroesophageal reflux disease) 09/11/2015  . Hyperlipidemia, mixed 09/11/2015  . Hx of abnormal cervical Papanicolaou smear 09/11/2015  . Vertigo 09/11/2015  . Vitamin D deficiency 09/11/2015    Past Surgical History:  Procedure Laterality Date  . ANKLE SURGERY Right 2006  . AUGMENTATION MAMMAPLASTY  2010  . COLONOSCOPY WITH PROPOFOL N/A 02/08/2016   Procedure: COLONOSCOPY WITH PROPOFOL;  Surgeon: Lucilla Lame, MD;  Location: [place];  Service: Endoscopy;  Laterality: N/A;  . POLYPECTOMY  02/08/2016   Procedure: POLYPECTOMY;  Surgeon: Lucilla Lame, MD;  Location: [facility];  Service: Endoscopy;;  . SHOULDER SURGERY Right 2000    OB History    No data available       Home Medications    Prior to Admission medications   Medication Sig Start Date End Date Taking? Authorizing Provider  Multiple Vitamins-Minerals (THRIVE FOR LIFE WOMENS PO) Take by mouth.    Yes [provider]  PHENTERMINE HCL PO Take 1 tablet by mouth daily.   Yes [provider]  SUMAtriptan (IMITREX) 100 MG tablet Take 1 tablet (100 mg total) by mouth every 2 (two) hours as needed for migraine. May repeat in 2 hours if headache persists or recurs. 12/31/15  Yes Glean Hess, MD  amoxicillin-clavulanate (AUGMENTIN) 875-125 MG tablet Take 1 tablet by mouth every 12 (twelve) hours. 05/08/17   Luvenia Redden, PA-C  benzonatate (TESSALON) 100 MG capsule Take 1 capsule (100 mg total) by mouth every 8 (eight) hours. 05/08/17   Luvenia Redden, PA-C  fluconazole (DIFLUCAN) 150 MG tablet Take 1 tablet (150 mg total) by mouth daily. Take one tablet. May repeat in 7 days if needed 05/08/17   Luvenia Redden, PA-C  meclizine (ANTIVERT) 25 MG tablet Take 25 mg by mouth 3 (three) times daily as needed for dizziness.    [provider]  OnabotulinumtoxinA (BOTOX IJ) Inject as directed. Administered ~ every 5 months for migraine prevention by practitioner in [facility]    [provider]    Family History Family History  Adopted: Yes  Family history unknown: Yes    Social History Social History   Tobacco Use  . Smoking status: Never Smoker  . Smokeless tobacco: Never Used  Substance Use Topics  .  Alcohol use: Yes    Alcohol/week: 4.2 oz    Types: 7 Glasses of wine per week  . Drug use: No     Allergies   Patient has no known allergies.   Review of Systems Review of Systems  As noted above in history of present illness. Other systems reviewed family negative   Physical Exam Triage Vital Signs ED Triage Vitals  Enc Vitals Group     BP 05/08/17 1653 133/84     Pulse Rate 05/08/17 1653 87     Resp 05/08/17 1653 16     Temp 05/08/17 1653 98.3 F (36.8 C)     Temp Source 05/08/17 1653 Oral     SpO2 05/08/17 1653 98 %     Weight 05/08/17 1649 145 lb (65.8 kg)     Height 05/08/17 1649 5\' 2"  (1.575 m)     Head Circumference --        Peak Flow --      Pain Score 05/08/17 1649 5     Pain Loc --      Pain Edu? --      Excl. in [place]? --    No data found.  Updated Vital Signs BP 133/84 (BP Location: Left Arm)   Pulse 87   Temp 98.3 F (36.8 C) (Oral)   Resp 16   Ht 5\' 2"  (1.575 m)   Wt 145 lb (65.8 kg)   SpO2 98%   BMI 26.52 kg/m   Physical Exam  Constitutional: She is oriented to person, place, and time. She appears well-developed and well-nourished. No distress.  HENT:  Head: Normocephalic and atraumatic.  Right Ear: Ear canal normal. A middle ear effusion is present.  Left Ear: Ear canal normal. A middle ear effusion is present.  Nose: Right sinus exhibits no maxillary sinus tenderness and no frontal sinus tenderness. Left sinus exhibits no maxillary sinus tenderness and no frontal sinus tenderness.  Mild throat erythema with clear postnasal drainage  Cardiovascular: Normal rate, regular rhythm and normal heart sounds. Exam reveals no friction rub.  No murmur heard. Pulmonary/Chest: Effort normal and breath sounds normal. No respiratory distress. She has no wheezes.  Wet cough with forced expiration.  Abdominal: Soft. Bowel sounds are normal.  Musculoskeletal: Normal range of motion.  Lymphadenopathy:    She has no cervical adenopathy.  Neurological: She is oriented to person, place, and time. No cranial nerve deficit.  Skin: Skin is warm and dry.     UC Treatments / Results  Labs (all labs ordered are listed, but only abnormal results are displayed) Labs Reviewed - No data to display  EKG  EKG Interpretation None       Radiology Dg Chest 2 View  Result Date: 05/08/2017 CLINICAL DATA:  Ten day history of cough and chest congestion. EXAM: CHEST  2 VIEW COMPARISON:  None. FINDINGS: Cardiomediastinal silhouette unremarkable. Lungs clear. Bronchovascular markings normal. Pulmonary vascularity normal. No visible pleural effusions. No pneumothorax. Mild degenerative changes involving the  thoracic spine. IMPRESSION: No acute cardiopulmonary disease. Electronically Signed   By: Evangeline Dakin M.D.   On: 05/08/2017 17:18    Procedures Procedures (including critical care time)  Medications Ordered in UC Medications - No data to display   Initial Impression / Assessment and Plan / UC Course  I have reviewed the triage vital signs and the nursing notes.  Pertinent labs & imaging results that were available during my care of the patient were reviewed by me and considered  in my medical decision making (see chart for details).    Patient had cough 10 days is becoming worse now with left worse than right ear pain. Patient does have bilateral ear effusions. Also with runny nose and some postnasal drip. Given her worsening cough over and check a chest x-ray. Final Clinical Impressions(s) / UC Diagnoses   Final diagnoses:  Upper respiratory tract infection, unspecified type  Bilateral otitis media, unspecified otitis media type  Cough   Chest x-ray negative for acute disease. Give a prescription for Augmentin for her ear infection. Also give her a prescription for Diflucan and she is needed in the past with antibiotics and also give her Tessalon for her cough. Also recommend over-the-counter Zyrtec, Claritin, Flonase, lysing patient is well likely help with clearing up her laboratory issues and help alleviate her ear pain.  ED Discharge Orders        Ordered    amoxicillin-clavulanate (AUGMENTIN) 875-125 MG tablet  Every 12 hours     05/08/17 1720    fluconazole (DIFLUCAN) 150 MG tablet  Daily     05/08/17 1720    benzonatate (TESSALON) 100 MG capsule  Every 8 hours     05/08/17 1720       Controlled Substance Prescriptions [place] Controlled Substance Registry consulted? Not Applicable   Luvenia Redden, PA-C 05/08/17 1722

## 2017-05-08 NOTE — ED Triage Notes (Signed)
Patient c/o cough and congestion for 10 days.  Patient also c/o pain in her left ear for the past 2 days.

## 2017-05-08 NOTE — Discharge Instructions (Signed)
-  Augmentin: one tablet twice a day for 7 days -Tessalon: 1 tablet every 8 hours as needed for cough -Diflucan: Take 1 tablet. Can repeat in 7 days if needed. -Can use over-the-counter medications like Zyrtec, Claritin, or Flonase to help with her upper respiratory symptoms. These are also help to clear out the passages to help relieve ear issues. -Ibuprofen or Tylenol for pain.

## 2017-09-16 ENCOUNTER — Telehealth: Payer: Self-pay | Admitting: Gastroenterology

## 2017-09-16 NOTE — Telephone Encounter (Signed)
ERROR

## 2017-09-17 ENCOUNTER — Other Ambulatory Visit: Payer: Commercial Managed Care - PPO

## 2017-09-17 ENCOUNTER — Ambulatory Visit: Payer: Commercial Managed Care - PPO | Admitting: Gastroenterology

## 2017-09-17 ENCOUNTER — Other Ambulatory Visit: Payer: Self-pay

## 2017-09-17 ENCOUNTER — Encounter: Payer: Self-pay | Admitting: Gastroenterology

## 2017-09-17 VITALS — BP 131/67 | HR 91 | Temp 98.3°F | Ht 62.0 in | Wt 146.4 lb

## 2017-09-17 DIAGNOSIS — R1319 Other dysphagia: Secondary | ICD-10-CM

## 2017-09-17 DIAGNOSIS — R490 Dysphonia: Secondary | ICD-10-CM

## 2017-09-17 DIAGNOSIS — N938 Other specified abnormal uterine and vaginal bleeding: Secondary | ICD-10-CM | POA: Insufficient documentation

## 2017-09-17 MED ORDER — OMEPRAZOLE 40 MG PO CPDR: 40.0000 mg | DELAYED_RELEASE_CAPSULE | Freq: Every day | ORAL | 3 refills | Status: DC

## 2017-09-17 NOTE — Progress Notes (Signed)
Jonathon Bellows MD, MRCP(U.K) [address]  [place], [place] [address]  Main: [phone]  Fax: [phone]   Gastroenterology Consultation  Referring Provider:     Glean Hess, MD Primary Care Physician:  Glean Hess, MD Primary Gastroenterologist:  Dr. Jonathon Bellows  Reason for Consultation:     Issues with swallowing         HPI:   Caitlin Ware is a 52 y.o. y/o female referred for consultation & management  by Dr. Army Melia, Jesse Sans, MD.    She is here today to see me for swallowing issues. She says that the primary reason she is seeing me is for hoarsness of voice. Ongoing for more than a few weeks. 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 Initially thought it was an allergy . Worse this week. No cough . Never smoked. Never chewed tobacco. She is adopted. No weight loss. She also says that She has had issues with swallowing as well. Last 3 to 6 months gradually having solids getting stuck like rice, bread, pasta . Points to her neck, goes down eventually. 4 years back had a dilation. Goes down gradually. Cannot drink while she is having this issue.   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 She used to be a Barrister's clerk. No recent flu like illness or cough . She had a cold in December.  Not on any meds for reflux.     Past Medical History:  Diagnosis Date  . Adopted   . Eczema   . Headache    migraines  . Motion sickness    cars  . Vertigo     Past Surgical History:  Procedure Laterality Date  . ANKLE SURGERY Right 2006  . AUGMENTATION MAMMAPLASTY  2010  . COLONOSCOPY WITH PROPOFOL N/A 02/08/2016   Procedure: COLONOSCOPY WITH PROPOFOL;  Surgeon: Lucilla Lame, MD;  Location: [place];  Service: Endoscopy;  Laterality: N/A;  . POLYPECTOMY  02/08/2016   Procedure: POLYPECTOMY;  Surgeon: Lucilla Lame, MD;  Location: [facility];  Service: Endoscopy;;  . SHOULDER SURGERY Right 2000    Prior to Admission medications    Medication Sig Start Date End Date Taking? Authorizing Provider  amoxicillin-clavulanate (AUGMENTIN) 875-125 MG tablet Take 1 tablet by mouth every 12 (twelve) hours. Patient not taking: Reported on 09/15/2017 05/08/17   Luvenia Redden, PA-C  benzonatate (TESSALON) 100 MG capsule Take 1 capsule (100 mg total) by mouth every 8 (eight) hours. Patient not taking: Reported on 09/15/2017 05/08/17   Luvenia Redden, PA-C  Carboxymeth-Glyc-Polysorb PF (REFRESH OPTIVE MEGA-3) 0.5-1-0.5 % SOLN Place 1 drop into both eyes 2 (two) times daily.    [provider]  fluconazole (DIFLUCAN) 150 MG tablet Take 1 tablet (150 mg total) by mouth daily. Take one tablet. May repeat in 7 days if needed Patient not taking: Reported on 09/15/2017 05/08/17   Luvenia Redden, PA-C  naproxen sodium (ALEVE) 220 MG tablet Take 440 mg by mouth 2 (two) times daily as needed (for pain/headaches.).    [provider]  Omega-3 Fatty Acids (FISH OIL PO) Take 2,000 mg by mouth daily. Liquid    [provider]  OnabotulinumtoxinA (BOTOX IJ) Inject as directed. Administered ~ every 5 months for migraine prevention by practitioner in [facility]    [provider]  [place] 28 0.25-35 MG-MCG tablet Take 1 tablet by mouth at bedtime. 07/23/17   [provider]  SUMAtriptan (IMITREX) 100 MG tablet Take 1 tablet (100 mg total) by mouth every 2 (two) hours as needed for migraine. May repeat in 2 hours if headache persists or recurs. 12/31/15   Glean Hess, MD    Family History  Adopted: Yes  Family history unknown: Yes     Social History   Tobacco Use  . Smoking status: Never Smoker  . Smokeless tobacco: Never Used  Substance Use Topics  . Alcohol use: Yes    Alcohol/week: 4.2 oz    Types: 7 Glasses of wine per week  . Drug use: No    Allergies as of 09/17/2017  . (No Known Allergies)    Review of Systems:    All systems reviewed and negative except where noted in HPI.    Physical Exam:  There were no vitals taken for this visit. No LMP recorded. Patient is perimenopausal. Psych:  Alert and cooperative. Normal mood and affect. General:   Alert,  Well-developed, well-nourished, pleasant and cooperative in NAD Head:  Normocephalic and atraumatic. Eyes:  Sclera clear, no icterus.   Conjunctiva pink. Ears:  Normal auditory acuity. Nose:  No deformity, discharge, or lesions. Mouth:  No deformity or lesions,oropharynx pink & moist. Neck:  Supple; no masses or thyromegaly. Lungs:  Respirations even and unlabored.  Clear throughout to auscultation.   No wheezes, crackles, or rhonchi. No acute distress. Heart:  Regular rate and rhythm; no murmurs, clicks, rubs, or gallops. Abdomen:  Normal bowel sounds.  No bruits.  Soft, non-tender and non-distended without masses, hepatosplenomegaly or hernias noted.  No guarding or rebound tenderness.    Neurologic:  Alert and oriented x3;  grossly normal neurologically. Skin:  Intact without significant lesions or rashes. No jaundice. Lymph Nodes:  No significant cervical adenopathy. Psych:  Alert and cooperative. Normal mood and affect.  Imaging Studies: No results found.  Assessment and Plan:   Caitlin Ware is a 52 y.o. y/o female has been referred for change in Voice and dysphagia. Unclear reason for the hoarse voice and her voice is very obviously horse today. She also has recurrence of dysphagia and history of prior stricture.    Plan   1. PPI 2.  EGD+/- dilation on May 14 th 8 am  3 . Refer to ENT to evaluate hoarse voice - h/o vocalnodules.   I have discussed alternative options, risks & benefits,  which include, but are not limited to, bleeding, infection, perforation,respiratory complication & drug reaction.  The patient agrees with this plan & written consent will be obtained.   Follow up in 8 weeks   Dr Jonathon Bellows MD,MRCP(U.K)

## 2017-09-18 ENCOUNTER — Other Ambulatory Visit: Payer: Self-pay

## 2017-09-18 ENCOUNTER — Encounter
Admission: RE | Admit: 2017-09-18 | Discharge: 2017-09-18 | Disposition: A | Discharge status: Home / Self Care | Payer: Commercial Managed Care - PPO | Source: Ambulatory Visit | Attending: Obstetrics & Gynecology | Admitting: Obstetrics & Gynecology

## 2017-09-18 DIAGNOSIS — Z01812 Encounter for preprocedural laboratory examination: Secondary | ICD-10-CM | POA: Diagnosis not present

## 2017-09-18 HISTORY — DX: Gastro-esophageal reflux disease without esophagitis: K21.9

## 2017-09-18 HISTORY — DX: Other specified postprocedural states: Z98.890

## 2017-09-18 HISTORY — DX: Nausea with vomiting, unspecified: R11.2

## 2017-09-18 LAB — BASIC METABOLIC PANEL
Anion gap: 6 (ref 5–15)
BUN: 13 mg/dL (ref 6–20)
CHLORIDE: 106 mmol/L (ref 101–111)
CO2: 25 mmol/L (ref 22–32)
Calcium: 8.5 mg/dL — ABNORMAL LOW (ref 8.9–10.3)
Creatinine, Ser: 0.71 mg/dL (ref 0.44–1.00)
Glucose, Bld: 89 mg/dL (ref 65–99)
Potassium: 3.8 mmol/L (ref 3.5–5.1)
SODIUM: 137 mmol/L (ref 135–145)

## 2017-09-18 LAB — TYPE AND SCREEN
ABO/RH(D): A POS
Antibody Screen: NEGATIVE

## 2017-09-18 LAB — CBC
HCT: 37 % (ref 35.0–47.0)
Hemoglobin: 12.5 g/dL (ref 12.0–16.0)
MCH: 30.5 pg (ref 26.0–34.0)
MCHC: 33.8 g/dL (ref 32.0–36.0)
MCV: 90.1 fL (ref 80.0–100.0)
PLATELETS: 277 10*3/uL (ref 150–440)
RBC: 4.11 MIL/uL (ref 3.80–5.20)
RDW: 13.5 % (ref 11.5–14.5)
WBC: 5.3 10*3/uL (ref 3.6–11.0)

## 2017-09-18 NOTE — Patient Instructions (Signed)
Your procedure is scheduled on: Friday 09/25/17 Report to [facility]. To find out your arrival time please call [phone] between 1PM - 3PM on Thursday 09/24/17.  Remember: Instructions that are not followed completely may result in serious medical risk, up to and including death, or upon the discretion of your surgeon and anesthesiologist your surgery may need to be rescheduled.     _X__ 1. Do not eat food after midnight the night before your procedure.                 No gum chewing or hard candies. You may drink clear liquids up to 2 hours                 before you are scheduled to arrive for your surgery- DO not drink clear                 liquids within 2 hours of the start of your surgery.                 Clear Liquids include:  water, apple juice without pulp, clear carbohydrate                 drink such as Clearfast or Gatorade, Black Coffee or Tea (Do not add                 anything to coffee or tea).  __X__2.  On the morning of surgery brush your teeth with toothpaste and water, you                 may rinse your mouth with mouthwash if you wish.  Do not swallow any              toothpaste of mouthwash.     _X__ 3.  No Alcohol for 24 hours before or after surgery.   _X__ 4.  Do Not Smoke or use e-cigarettes For 24 Hours Prior to Your Surgery.                 Do not use any chewable tobacco products for at least 6 hours prior to                 surgery.  ____  5.  Bring all medications with you on the day of surgery if instructed.   __X__  6.  Notify your doctor if there is any change in your medical condition      (cold, fever, infections).     Do not wear jewelry, make-up, hairpins, clips or nail polish. Do not wear lotions, powders, or perfumes.  Do not shave 48 hours prior to surgery. Men may shave face and neck. Do not bring valuables to the hospital.    [facility] is not responsible for any belongings or  valuables.  Contacts, dentures/partials or body piercings may not be worn into surgery. Bring a case for your contacts, glasses or hearing aids, a denture cup will be supplied. Leave your suitcase in the car. After surgery it may be brought to your room. For patients admitted to the hospital, discharge time is determined by your treatment team.   Patients discharged the day of surgery will not be allowed to drive home.   Please read over the following fact sheets that you were given:   MRSA Information  __X__ Take these medicines the morning of surgery with A SIP OF WATER:  1. Omeprazole at bedtime and again am of surgery  2.   3.   4.  5.  6.  ____ Fleet Enema (as directed)   ____ Use CHG Soap/SAGE wipes as directed  ____ Use inhalers on the day of surgery  ____ Stop metformin/Janumet/Farxiga 2 days prior to surgery    ____ Take 1/2 of usual insulin dose the night before surgery. No insulin the morning          of surgery.   ____ Stop Blood Thinners Coumadin/Plavix/Xarelto/Pleta/Pradaxa/Eliquis/Effient/Aspirin  on   Or contact your Surgeon, Cardiologist or Medical Doctor regarding  ability to stop your blood thinners  __X__ Stop Anti-inflammatories 7 days before surgery such as Advil, Ibuprofen, Motrin,  BC or Goodies Powder, Naprosyn, Naproxen, Aleve, Aspirin    __X__ Stop all herbal supplements, fish oil or vitamin E until after surgery.  TODAY  ____ Bring C-Pap to the hospital.

## 2017-09-21 LAB — CALCITRIOL (1,25 DI-OH VIT D): Vit D, 1,25-Dihydroxy: 71.9 pg/mL (ref 19.9–79.3)

## 2017-09-22 ENCOUNTER — Telehealth: Payer: Self-pay | Admitting: Gastroenterology

## 2017-09-22 NOTE — Telephone Encounter (Signed)
Dan With [facility] is calling pt is having EGD  He would like to get Auth on file please call Care Coordinator at [phone]

## 2017-09-25 ENCOUNTER — Ambulatory Visit
Admission: RE | Admit: 2017-09-25 | Discharge: 2017-09-25 | Disposition: A | Discharge status: Home / Self Care | Payer: Commercial Managed Care - PPO | Source: Ambulatory Visit | Attending: Obstetrics & Gynecology | Admitting: Obstetrics & Gynecology

## 2017-09-25 ENCOUNTER — Encounter
Admission: RE | Disposition: A | Discharge status: Home / Self Care | Payer: Self-pay | Source: Ambulatory Visit | Attending: Obstetrics & Gynecology

## 2017-09-25 ENCOUNTER — Ambulatory Visit: Payer: Commercial Managed Care - PPO | Admitting: Certified Registered"

## 2017-09-25 ENCOUNTER — Encounter: Payer: Self-pay | Admitting: *Deleted

## 2017-09-25 ENCOUNTER — Other Ambulatory Visit: Payer: Self-pay

## 2017-09-25 DIAGNOSIS — G43909 Migraine, unspecified, not intractable, without status migrainosus: Secondary | ICD-10-CM | POA: Diagnosis not present

## 2017-09-25 DIAGNOSIS — Z79899 Other long term (current) drug therapy: Secondary | ICD-10-CM | POA: Diagnosis not present

## 2017-09-25 DIAGNOSIS — N8500 Endometrial hyperplasia, unspecified: Secondary | ICD-10-CM | POA: Insufficient documentation

## 2017-09-25 DIAGNOSIS — Z793 Long term (current) use of hormonal contraceptives: Secondary | ICD-10-CM | POA: Diagnosis not present

## 2017-09-25 DIAGNOSIS — N920 Excessive and frequent menstruation with regular cycle: Secondary | ICD-10-CM | POA: Diagnosis present

## 2017-09-25 HISTORY — PX: HYSTEROSCOPY WITH D & C: SHX1775

## 2017-09-25 LAB — ABO/RH: ABO/RH(D): A POS

## 2017-09-25 SURGERY — DILATATION AND CURETTAGE /HYSTEROSCOPY
Anesthesia: General | Site: Uterus | Wound class: Clean Contaminated

## 2017-09-25 MED ORDER — LIDOCAINE HCL (CARDIAC) PF 100 MG/5ML IV SOSY
PREFILLED_SYRINGE | INTRAVENOUS | Status: DC | PRN
  Administered 2017-09-25: 80 mg via INTRAVENOUS

## 2017-09-25 MED ORDER — ONDANSETRON HCL 4 MG/2ML IJ SOLN
INTRAMUSCULAR | Status: DC | PRN
  Administered 2017-09-25: 4 mg via INTRAVENOUS

## 2017-09-25 MED ORDER — ACETAMINOPHEN 10 MG/ML IV SOLN
INTRAVENOUS | Status: DC | PRN
  Administered 2017-09-25: 1000 mg via INTRAVENOUS

## 2017-09-25 MED ORDER — PHENYLEPHRINE HCL 10 MG/ML IJ SOLN
INTRAMUSCULAR | Status: AC
  Filled 2017-09-25: qty 1

## 2017-09-25 MED ORDER — GLYCOPYRROLATE 0.2 MG/ML IJ SOLN
INTRAMUSCULAR | Status: DC | PRN
  Administered 2017-09-25: 0.1 mg via INTRAVENOUS

## 2017-09-25 MED ORDER — MIDAZOLAM HCL 2 MG/2ML IJ SOLN
INTRAMUSCULAR | Status: DC | PRN
  Administered 2017-09-25: 2 mg via INTRAVENOUS

## 2017-09-25 MED ORDER — LIDOCAINE HCL (PF) 1 % IJ SOLN
INTRAMUSCULAR | Status: AC
  Filled 2017-09-25: qty 60

## 2017-09-25 MED ORDER — ACETAMINOPHEN 325 MG PO TABS: 650.0000 mg | ORAL_TABLET | ORAL | Status: DC | PRN

## 2017-09-25 MED ORDER — KETOROLAC TROMETHAMINE 30 MG/ML IJ SOLN
INTRAMUSCULAR | Status: AC
  Filled 2017-09-25: qty 1

## 2017-09-25 MED ORDER — ACETAMINOPHEN 650 MG RE SUPP
650.0000 mg | RECTAL | Status: DC | PRN
  Filled 2017-09-25: qty 1

## 2017-09-25 MED ORDER — PROPOFOL 500 MG/50ML IV EMUL
INTRAVENOUS | Status: DC | PRN
  Administered 2017-09-25: 125 ug/kg/min via INTRAVENOUS

## 2017-09-25 MED ORDER — ACETAMINOPHEN 10 MG/ML IV SOLN
INTRAVENOUS | Status: AC
  Filled 2017-09-25: qty 100

## 2017-09-25 MED ORDER — KETOROLAC TROMETHAMINE 30 MG/ML IJ SOLN
INTRAMUSCULAR | Status: DC | PRN
  Administered 2017-09-25: 30 mg via INTRAVENOUS

## 2017-09-25 MED ORDER — MIDAZOLAM HCL 2 MG/2ML IJ SOLN
INTRAMUSCULAR | Status: AC
  Filled 2017-09-25: qty 2

## 2017-09-25 MED ORDER — OXYCODONE HCL 5 MG/5ML PO SOLN: 5.0000 mg | Freq: Once | ORAL | Status: DC | PRN

## 2017-09-25 MED ORDER — ONDANSETRON HCL 4 MG/2ML IJ SOLN
INTRAMUSCULAR | Status: AC
  Filled 2017-09-25: qty 2

## 2017-09-25 MED ORDER — BUPIVACAINE-EPINEPHRINE (PF) 0.5% -1:200000 IJ SOLN
INTRAMUSCULAR | Status: AC
  Filled 2017-09-25: qty 30

## 2017-09-25 MED ORDER — DEXAMETHASONE SODIUM PHOSPHATE 10 MG/ML IJ SOLN
INTRAMUSCULAR | Status: DC | PRN
  Administered 2017-09-25: 10 mg via INTRAVENOUS

## 2017-09-25 MED ORDER — LACTATED RINGERS IV SOLN: INTRAVENOUS | Status: DC

## 2017-09-25 MED ORDER — KETOROLAC TROMETHAMINE 30 MG/ML IJ SOLN
30.0000 mg | Freq: Four times a day (QID) | INTRAMUSCULAR | Status: DC
  Filled 2017-09-25: qty 1

## 2017-09-25 MED ORDER — LIDOCAINE HCL (PF) 2 % IJ SOLN
INTRAMUSCULAR | Status: AC
  Filled 2017-09-25: qty 10

## 2017-09-25 MED ORDER — FENTANYL CITRATE (PF) 100 MCG/2ML IJ SOLN
INTRAMUSCULAR | Status: AC
Start: 2017-09-25 — End: ?
  Filled 2017-09-25: qty 2

## 2017-09-25 MED ORDER — PROPOFOL 10 MG/ML IV BOLUS
INTRAVENOUS | Status: AC
Start: 2017-09-25 — End: ?
  Filled 2017-09-25: qty 20

## 2017-09-25 MED ORDER — PROPOFOL 500 MG/50ML IV EMUL
INTRAVENOUS | Status: AC
Start: 2017-09-25 — End: ?
  Filled 2017-09-25: qty 50

## 2017-09-25 MED ORDER — MORPHINE SULFATE (PF) 4 MG/ML IV SOLN: 1.0000 mg | INTRAVENOUS | Status: DC | PRN

## 2017-09-25 MED ORDER — MEPERIDINE HCL 50 MG/ML IJ SOLN: 6.2500 mg | INTRAMUSCULAR | Status: DC | PRN

## 2017-09-25 MED ORDER — FENTANYL CITRATE (PF) 100 MCG/2ML IJ SOLN
INTRAMUSCULAR | Status: DC | PRN
  Administered 2017-09-25 (×2): 50 ug via INTRAVENOUS

## 2017-09-25 MED ORDER — PROMETHAZINE HCL 25 MG/ML IJ SOLN: 6.2500 mg | INTRAMUSCULAR | Status: DC | PRN

## 2017-09-25 MED ORDER — DEXAMETHASONE SODIUM PHOSPHATE 10 MG/ML IJ SOLN
INTRAMUSCULAR | Status: AC
  Filled 2017-09-25: qty 1

## 2017-09-25 MED ORDER — PROPOFOL 10 MG/ML IV BOLUS
INTRAVENOUS | Status: DC | PRN
  Administered 2017-09-25: 200 mg via INTRAVENOUS
  Administered 2017-09-25: 100 mg via INTRAVENOUS

## 2017-09-25 MED ORDER — OXYCODONE HCL 5 MG PO TABS: 5.0000 mg | ORAL_TABLET | Freq: Once | ORAL | Status: DC | PRN

## 2017-09-25 MED ORDER — LACTATED RINGERS IV SOLN
INTRAVENOUS | Status: DC
  Administered 2017-09-25 (×2): via INTRAVENOUS

## 2017-09-25 MED ORDER — FENTANYL CITRATE (PF) 100 MCG/2ML IJ SOLN: 25.0000 ug | INTRAMUSCULAR | Status: DC | PRN

## 2017-09-25 SURGICAL SUPPLY — 23 items
CANISTER SUC SOCK COL 7IN (MISCELLANEOUS) ×4 IMPLANT
CATH ROBINSON RED A/P 16FR (CATHETERS) ×4 IMPLANT
DEVICE MYOSURE LITE (MISCELLANEOUS) IMPLANT
DEVICE MYOSURE REACH (MISCELLANEOUS) IMPLANT
ELECT REM PT RETURN 9FT ADLT (ELECTROSURGICAL)
ELECTRODE REM PT RTRN 9FT ADLT (ELECTROSURGICAL) ×1 IMPLANT
GLOVE PI ORTHOPRO 6.5 (GLOVE) ×2
GLOVE PI ORTHOPRO STRL 6.5 (GLOVE) ×2 IMPLANT
GLOVE SURG SYN 6.5 ES PF (GLOVE) ×8 IMPLANT
GLOVE SURG SYN 6.5 PF PI (GLOVE) ×2 IMPLANT
GOWN STRL REUS W/ TWL LRG LVL3 (GOWN DISPOSABLE) ×4 IMPLANT
GOWN STRL REUS W/TWL LRG LVL3 (GOWN DISPOSABLE) ×8
KIT TURNOVER CYSTO (KITS) ×4 IMPLANT
PACK DNC HYST (MISCELLANEOUS) ×4 IMPLANT
PAD OB MATERNITY 4.3X12.25 (PERSONAL CARE ITEMS) ×4 IMPLANT
PAD PREP 24X41 OB/GYN DISP (PERSONAL CARE ITEMS) ×4 IMPLANT
SEAL ROD LENS SCOPE MYOSURE (ABLATOR) ×4 IMPLANT
SOL .9 NS 3000ML IRR  AL (IV SOLUTION) ×2
SOL .9 NS 3000ML IRR AL (IV SOLUTION) ×2
SOL .9 NS 3000ML IRR UROMATIC (IV SOLUTION) ×2 IMPLANT
TUBING CONNECTING 10 (TUBING) ×3 IMPLANT
TUBING CONNECTING 10' (TUBING) ×1
TUBING HYSTEROSCOPY DOLPHIN (MISCELLANEOUS) IMPLANT

## 2017-09-25 NOTE — Anesthesia Procedure Notes (Signed)
Procedure Name: LMA Insertion Date/Time: 09/25/2017 11:21 AM Performed by: Nile Riggs, CRNA Pre-anesthesia Checklist: Patient identified, Emergency Drugs available, Suction available, Patient being monitored and Timeout performed Patient Re-evaluated:Patient Re-evaluated prior to induction Oxygen Delivery Method: Circle system utilized Preoxygenation: Pre-oxygenation with 100% oxygen Induction Type: IV induction Ventilation: Mask ventilation without difficulty LMA: LMA inserted LMA Size: 3.5 Tube type: Oral Number of attempts: 1 Placement Confirmation: positive ETCO2,  CO2 detector and breath sounds checked- equal and bilateral Tube secured with: Tape

## 2017-09-25 NOTE — Anesthesia Post-op Follow-up Note (Signed)
Anesthesia QCDR form completed.        

## 2017-09-25 NOTE — Transfer of Care (Signed)
Immediate Anesthesia Transfer of Care Note  Patient: Caitlin Ware  Procedure(s) Performed: DILATATION AND CURETTAGE /HYSTEROSCOPY (N/A Uterus)  Patient Location: PACU  Anesthesia Type:General  Level of Consciousness: drowsy and patient cooperative  Airway & Oxygen Therapy: Patient Spontanous Breathing and Patient connected to face mask oxygen  Post-op Assessment: Report given to RN, Post -op Vital signs reviewed and stable and Patient moving all extremities  Post vital signs: Reviewed and stable  Last Vitals:  Vitals Value Taken Time  BP 100/57 09/25/2017 12:03 PM  Temp 36.7 C 09/25/2017 12:00 PM  Pulse 62 09/25/2017 12:05 PM  Resp 11 09/25/2017 12:05 PM  SpO2 98 % 09/25/2017 12:05 PM  Vitals shown include unvalidated device data.  Last Pain:  Vitals:   09/25/17 0836  TempSrc: Oral  PainSc: 0-No pain         Complications: No apparent anesthesia complications

## 2017-09-25 NOTE — H&P (Signed)
Preoperative History and Physical  Caitlin Ware is a 52 y.o. No obstetric history on file. here for surgical management of menorrhagia.   No significant preoperative concerns.  Uterus, anteverted, 9 x 5.5 x 7cm, normal echotexure. EE 1cm, with focal thickening at fundus Two intramural fibroids, both <2cm RO: 2.5 x 1.3 x 1.3 cm, WNL LO: 2.3 x 1.3 x 1.3cm, WNL   Proposed surgery: D&C hysteroscopy  Past Medical History:  Diagnosis Date  . Adopted   . Eczema   . GERD (gastroesophageal reflux disease)   . Headache    migraines  . Motion sickness    cars  . PONV (postoperative nausea and vomiting)   . Vertigo    Past Surgical History:  Procedure Laterality Date  . ANKLE SURGERY Right 2006  . AUGMENTATION MAMMAPLASTY  2010  . COLONOSCOPY WITH PROPOFOL N/A 02/08/2016   Procedure: COLONOSCOPY WITH PROPOFOL;  Surgeon: Lucilla Lame, MD;  Location: [facility];  Service: Endoscopy;  Laterality: N/A;  . POLYPECTOMY  02/08/2016   Procedure: POLYPECTOMY;  Surgeon: Lucilla Lame, MD;  Location: [facility];  Service: Endoscopy;;  . SHOULDER SURGERY Right 2000   OB History  No data available  Patient denies any other pertinent gynecologic issues.   No current facility-administered medications on file prior to encounter.    Current Outpatient Medications on File Prior to Encounter  Medication Sig Dispense Refill  . Carboxymeth-Glyc-Polysorb PF (REFRESH OPTIVE MEGA-3) 0.5-1-0.5 % SOLN Place 1 drop into both eyes 2 (two) times daily.    . naproxen sodium (ALEVE) 220 MG tablet Take 440 mg by mouth 2 (two) times daily as needed (for pain/headaches.).    Marland Kitchen Omega-3 Fatty Acids (FISH OIL PO) Take 2,000 mg by mouth daily. Liquid    . OnabotulinumtoxinA (BOTOX IJ) Inject as directed. Administered ~ every 5 months for migraine prevention by practitioner in [place]    . SPRINTEC 28 0.25-35 MG-MCG tablet Take 1 tablet by mouth at bedtime.  3  . SUMAtriptan (IMITREX) 100 MG tablet Take  1 tablet (100 mg total) by mouth every 2 (two) hours as needed for migraine. May repeat in 2 hours if headache persists or recurs. 10 tablet 0   No Known Allergies  Social History:   reports that she has never smoked. She has never used smokeless tobacco. She reports that she drinks about 4.2 oz of alcohol per week. She reports that she does not use drugs.  Family History  Adopted: Yes  Family history unknown: Yes    Review of Systems: Noncontributory  PHYSICAL EXAM: Blood pressure 114/71, pulse 66, temperature 97.9 F (36.6 C), temperature source Oral, resp. rate 16, height 5\' 2"  (1.575 m), weight 65.8 kg (145 lb), SpO2 100 %. General appearance - alert, well appearing, and in no distress Chest - clear to auscultation, no wheezes, rales or rhonchi, symmetric air entry Heart - normal rate and regular rhythm Abdomen - soft, nontender, nondistended, no masses or organomegaly Pelvic - examination not indicated Extremities - peripheral pulses normal, no pedal edema, no clubbing or cyanosis  Labs: Results for orders placed or performed during the hospital encounter of 09/25/17 (from the past 336 hour(s))  ABO/Rh   Collection Time: 09/25/17  8:52 AM  Result Value Ref Range   ABO/RH(D)      A POS Performed at [facility], [address], [place], [place] [address]   Results for orders placed or performed during the hospital encounter of 09/18/17 (from the past  336 hour(s))  CBC   Collection Time: 09/18/17  9:38 AM  Result Value Ref Range   WBC 5.3 3.6 - 11.0 K/uL   RBC 4.11 3.80 - 5.20 MIL/uL   Hemoglobin 12.5 12.0 - 16.0 g/dL   HCT 37.0 35.0 - 47.0 %   MCV 90.1 80.0 - 100.0 fL   MCH 30.5 26.0 - 34.0 pg   MCHC 33.8 32.0 - 36.0 g/dL   RDW 13.5 11.5 - 14.5 %   Platelets 277 150 - 440 K/uL  Basic metabolic panel   Collection Time: 09/18/17  9:38 AM  Result Value Ref Range   Sodium 137 135 - 145 mmol/L   Potassium 3.8 3.5 - 5.1 mmol/L   Chloride 106 101 - 111 mmol/L    CO2 25 22 - 32 mmol/L   Glucose, Bld 89 65 - 99 mg/dL   BUN 13 6 - 20 mg/dL   Creatinine, Ser 0.71 0.44 - 1.00 mg/dL   Calcium 8.5 (L) 8.9 - 10.3 mg/dL   GFR calc non Af Amer >60 >60 mL/min   GFR calc Af Amer >60 >60 mL/min   Anion gap 6 5 - 15  Type and screen [place]   Collection Time: 09/18/17  9:38 AM  Result Value Ref Range   ABO/RH(D) A POS    Antibody Screen NEG    Sample Expiration 10/02/2017    Extend sample reason      NO TRANSFUSIONS OR PREGNANCY IN THE PAST 3 MONTHS Performed at [facility], [address] [place], [place] [address]   Calcitriol (1,25 di-OH Vit D)   Collection Time: 09/18/17  9:39 AM  Result Value Ref Range   Vit D, 1,25-Dihydroxy 71.9 19.9 - 79.3 pg/mL    Imaging Studies: No results found.  Assessment: Patient Active Problem List   Diagnosis Date Noted  . DUB (dysfunctional uterine bleeding) 09/17/2017  . Special screening for malignant neoplasms, colon   . Benign neoplasm of cecum   . Migraine headache 09/11/2015  . GERD (gastroesophageal reflux disease) 09/11/2015  . Hyperlipidemia, mixed 09/11/2015  . Hx of abnormal cervical Papanicolaou smear 09/11/2015  . Vertigo 09/11/2015  . Vitamin D deficiency 09/11/2015    Plan: Patient will undergo surgical management with dilation and curettage, with hysteroscopy.   The risks of surgery were discussed in detail with the patient including but not limited to: bleeding which may require transfusion or reoperation; infection which may require antibiotics; injury to surrounding organs which may involve bowel, bladder, ureters ; need for additional procedures including laparoscopy or laparotomy; thromboembolic phenomenon, surgical site problems and other postoperative/anesthesia complications. Likelihood of success in alleviating the patient's condition was discussed. Routine postoperative instructions will be reviewed with the patient and her family in detail after  surgery.  The patient concurred with the proposed plan, giving informed written consent for the surgery.  Patient has been NPO since last night she will remain NPO for procedure.  Anesthesia and OR aware.  To OR when ready.  ----- Larey Days, MD Attending Obstetrician and Gynecologist [facility], Department of [facility]

## 2017-09-25 NOTE — Anesthesia Preprocedure Evaluation (Addendum)
Anesthesia Evaluation  Patient identified by MRN, date of birth, ID band Patient awake    Reviewed: Allergy & Precautions, NPO status , Patient's Chart, lab work & pertinent test results  History of Anesthesia Complications (+) PONV and history of anesthetic complications  Airway Mallampati: II  TM Distance: >3 FB Neck ROM: Full    Dental no notable dental hx.    Pulmonary neg pulmonary ROS, neg sleep apnea, neg COPD,    breath sounds clear to auscultation- rhonchi (-) wheezing      Cardiovascular Exercise Tolerance: Good (-) hypertension(-) CAD, (-) Past MI, (-) Cardiac Stents and (-) CABG  Rhythm:Regular Rate:Normal - Systolic murmurs and - Diastolic murmurs    Neuro/Psych  Headaches, negative psych ROS   GI/Hepatic Neg liver ROS, GERD  ,  Endo/Other  negative endocrine ROSneg diabetes  Renal/GU negative Renal ROS     Musculoskeletal negative musculoskeletal ROS (+)   Abdominal (+) - obese,   Peds  Hematology negative hematology ROS (+)   Anesthesia Other Findings Past Medical History: No date: Adopted No date: Eczema No date: GERD (gastroesophageal reflux disease) No date: Headache     Comment:  migraines No date: Motion sickness     Comment:  cars No date: PONV (postoperative nausea and vomiting) No date: Vertigo   Reproductive/Obstetrics                            Anesthesia Physical Anesthesia Plan  ASA: II  Anesthesia Plan: General   Post-op Pain Management:    Induction: Intravenous  PONV Risk Score and Plan: 3 and TIVA, Propofol infusion, Midazolam and Ondansetron  Airway Management Planned: LMA  Additional Equipment:   Intra-op Plan:   Post-operative Plan:   Informed Consent: I have reviewed the patients History and Physical, chart, labs and discussed the procedure including the risks, benefits and alternatives for the proposed anesthesia with the patient or  authorized representative who has indicated his/her understanding and acceptance.   Dental advisory given  Plan Discussed with: CRNA and Anesthesiologist  Anesthesia Plan Comments:        Anesthesia Quick Evaluation

## 2017-09-25 NOTE — Discharge Instructions (Signed)
You should expect to have some cramping and vaginal bleeding for about a week. This should taper off and subside, much like a period. If heavy bleeding continues or gets worse, you should contact the office for an earlier appointment.   Please call the office or physician on call for fever >101, severe pain, and heavy bleeding.   [place]!!  Dr. Leonides Schanz will discuss pathology results with you at your postop visit.

## 2017-09-25 NOTE — Op Note (Signed)
Operative Report Hysteroscopy, Dilation and Curettage 09/25/2017  Patient:  Caitlin Ware  52 y.o. female Preoperative diagnosis:  Thickened endometrium Postoperative diagnosis:  Thickened endometrium  PROCEDURE:  Procedure(s): DILATATION AND CURETTAGE /HYSTEROSCOPY (N/A) Surgeon:  Surgeon(s) and Role:    * Ward, Honor Loh, MD - Primary Anesthesia:   I/O: Total I/O In: 1500 [I.V.:1500] Out: 30 [Blood:30] Specimens:  Endometrial curettings Complications: None Apparent Disposition:  VS stable to PACU  Findings: Uterus, mobile, normal size, sounding to 7 cm; normal cervix, vagina, perineum.  Unremarkable endometrium.  Indication for procedure/Consents: 52 y.o. here for scheduled surgery for the aforementioned diagnoses, with menorrhagia.  Risks of surgery were discussed with the patient including but not limited to: bleeding which may require transfusion; infection which may require antibiotics; injury to uterus or surrounding organs; intrauterine scarring which may impair future fertility; need for additional procedures including laparotomy or laparoscopy; and other postoperative/anesthesia complications. Written informed consent was obtained.    Procedure Details:   The patient was then taken to the operating room where anesthesia was administered and was found to be adequate.  After a formal timeout was performed, she was placed in the dorsal lithotomy position and examined with the above findings. She was then prepped and draped in the sterile manner.  A speculum was then placed in the patient's vagina and a single tooth tenaculum was applied to the anterior lip of the cervix.  A paracervical block was placed.  The uterus was sounded to 7cm. Her cervix was serially dilated to accommodate the myoscope, with findings as above. A sharp curettage was then performed until there was a gritty texture in all four quadrants. The specimen was handed off to nursing.  The camera was reinserted and  confirmed the uterus had been evacuated. The tenaculum was removed from the anterior lip of the cervix and the vaginal speculum was removed after noting good hemostasis. The patient tolerated the procedure well and was taken to the recovery area awake, extubated and in stable condition.  The patient will be discharged to home as per PACU criteria.  Routine postoperative instructions given. She will follow up in the clinic in two to four weeks for postoperative evaluation.  Larey Days, MD [facility] OBGYN Attending Gynecologist

## 2017-09-26 ENCOUNTER — Encounter: Payer: Self-pay | Admitting: Obstetrics & Gynecology

## 2017-09-28 ENCOUNTER — Encounter: Payer: Self-pay | Admitting: *Deleted

## 2017-09-28 LAB — SURGICAL PATHOLOGY

## 2017-09-28 NOTE — Anesthesia Postprocedure Evaluation (Signed)
Anesthesia Post Note  Patient: Caitlin Ware  Procedure(s) Performed: DILATATION AND CURETTAGE /HYSTEROSCOPY (N/A Uterus)  Patient location during evaluation: PACU Anesthesia Type: General Level of consciousness: awake and alert Pain management: pain level controlled Vital Signs Assessment: post-procedure vital signs reviewed and stable Respiratory status: spontaneous breathing, nonlabored ventilation, respiratory function stable and patient connected to nasal cannula oxygen Cardiovascular status: blood pressure returned to baseline and stable Postop Assessment: no apparent nausea or vomiting Anesthetic complications: no     Last Vitals:  Vitals:   09/25/17 1230 09/25/17 1246  BP: 116/70 131/78  Pulse: 64 (!) 47  Resp: 15 16  Temp: 36.6 C (!) 35.9 C  SpO2: 100% 100%    Last Pain:  Vitals:   09/25/17 1246  TempSrc: Oral  PainSc: 2                  Molli Barrows

## 2017-09-29 ENCOUNTER — Ambulatory Visit: Payer: Commercial Managed Care - PPO | Admitting: Certified Registered"

## 2017-09-29 ENCOUNTER — Encounter: Payer: Self-pay | Admitting: Anesthesiology

## 2017-09-29 ENCOUNTER — Encounter
Admission: RE | Disposition: A | Discharge status: Home / Self Care | Payer: Self-pay | Source: Ambulatory Visit | Attending: Gastroenterology

## 2017-09-29 ENCOUNTER — Ambulatory Visit
Admission: RE | Admit: 2017-09-29 | Discharge: 2017-09-29 | Disposition: A | Discharge status: Home / Self Care | Payer: Commercial Managed Care - PPO | Source: Ambulatory Visit | Attending: Gastroenterology | Admitting: Gastroenterology

## 2017-09-29 DIAGNOSIS — L309 Dermatitis, unspecified: Secondary | ICD-10-CM | POA: Insufficient documentation

## 2017-09-29 DIAGNOSIS — R42 Dizziness and giddiness: Secondary | ICD-10-CM | POA: Diagnosis not present

## 2017-09-29 DIAGNOSIS — R1310 Dysphagia, unspecified: Secondary | ICD-10-CM | POA: Insufficient documentation

## 2017-09-29 DIAGNOSIS — K219 Gastro-esophageal reflux disease without esophagitis: Secondary | ICD-10-CM | POA: Insufficient documentation

## 2017-09-29 DIAGNOSIS — R1319 Other dysphagia: Secondary | ICD-10-CM

## 2017-09-29 DIAGNOSIS — Z86010 Personal history of colonic polyps: Secondary | ICD-10-CM | POA: Diagnosis not present

## 2017-09-29 DIAGNOSIS — G43909 Migraine, unspecified, not intractable, without status migrainosus: Secondary | ICD-10-CM | POA: Diagnosis not present

## 2017-09-29 DIAGNOSIS — Z79899 Other long term (current) drug therapy: Secondary | ICD-10-CM | POA: Insufficient documentation

## 2017-09-29 HISTORY — PX: ESOPHAGOGASTRODUODENOSCOPY (EGD) WITH PROPOFOL: SHX5813

## 2017-09-29 LAB — POCT PREGNANCY, URINE: Preg Test, Ur: NEGATIVE

## 2017-09-29 SURGERY — ESOPHAGOGASTRODUODENOSCOPY (EGD) WITH PROPOFOL
Anesthesia: General

## 2017-09-29 MED ORDER — MIDAZOLAM HCL 2 MG/2ML IJ SOLN
INTRAMUSCULAR | Status: AC
  Filled 2017-09-29: qty 2

## 2017-09-29 MED ORDER — PROPOFOL 10 MG/ML IV BOLUS
INTRAVENOUS | Status: AC
  Filled 2017-09-29: qty 20

## 2017-09-29 MED ORDER — LIDOCAINE HCL (CARDIAC) PF 100 MG/5ML IV SOSY
PREFILLED_SYRINGE | INTRAVENOUS | Status: DC | PRN
  Administered 2017-09-29: 100 mg via INTRAVENOUS

## 2017-09-29 MED ORDER — SODIUM CHLORIDE 0.9 % IV SOLN
INTRAVENOUS | Status: DC
  Administered 2017-09-29: 1000 mL via INTRAVENOUS

## 2017-09-29 MED ORDER — LIDOCAINE HCL (PF) 2 % IJ SOLN
INTRAMUSCULAR | Status: AC
  Filled 2017-09-29: qty 10

## 2017-09-29 MED ORDER — PROPOFOL 500 MG/50ML IV EMUL
INTRAVENOUS | Status: DC | PRN
  Administered 2017-09-29: 150 ug/kg/min via INTRAVENOUS

## 2017-09-29 MED ORDER — PROPOFOL 10 MG/ML IV BOLUS
INTRAVENOUS | Status: DC | PRN
  Administered 2017-09-29: 80 mg via INTRAVENOUS
  Administered 2017-09-29: 20 mg via INTRAVENOUS

## 2017-09-29 NOTE — Anesthesia Procedure Notes (Signed)
Performed by: Dajon Lazar, CRNA Pre-anesthesia Checklist: Patient identified, Emergency Drugs available, Suction available, Patient being monitored and Timeout performed Patient Re-evaluated:Patient Re-evaluated prior to induction Oxygen Delivery Method: Nasal cannula Induction Type: IV induction       

## 2017-09-29 NOTE — Op Note (Signed)
[facility] Gastroenterology Patient Name: Caitlin Ware Procedure Date: 09/29/2017 9:58 AM MRN: [id number] Account #: [id number] Date of Birth: 1966-01-21 Admit Type: Outpatient Age: 52 Room: [facility] ENDO ROOM 1 Gender: Female Note Status: Finalized Procedure:            Upper GI endoscopy Indications:          Dysphagia Providers:            Jonathon Bellows MD, MD Referring MD:         [name], MD (Referring MD) Medicines:            Monitored Anesthesia Care Complications:        No immediate complications. Procedure:            Pre-Anesthesia Assessment:                       - Prior to the procedure, a History and Physical was                        performed, and patient medications, allergies and                        sensitivities were reviewed. The patient's tolerance of                        previous anesthesia was reviewed.                       - The risks and benefits of the procedure and the                        sedation options and risks were discussed with the                        patient. All questions were answered and informed                        consent was obtained.                       - ASA Grade Assessment: II - A patient with mild                        systemic disease.                       After obtaining informed consent, the endoscope was                        passed under direct vision. Throughout the procedure,                        the patient's blood pressure, pulse, and oxygen                        saturations were monitored continuously. The                        Colonoscope was introduced through the mouth, and                        advanced  to the third part of duodenum. The upper GI                        endoscopy was accomplished with ease. The patient                        tolerated the procedure well. Findings:      The examined duodenum was normal.      The stomach was normal.      The examined  esophagus was normal. Biopsies were taken with a cold       forceps for histology.      The cardia and gastric fundus were normal on retroflexion. Impression:           - Normal examined duodenum.                       - Normal stomach.                       - Normal esophagus. Biopsied. Recommendation:       - Discharge patient to home (with escort).                       - Resume previous diet.                       - Continue present medications.                       - Await pathology results.                       - Return to my office in 6 weeks. Procedure Code(s):    --- Professional ---                       [phone], Esophagogastroduodenoscopy, flexible, transoral;                        with biopsy, single or multiple Diagnosis Code(s):    --- Professional ---                       R13.10, Dysphagia, unspecified CPT copyright 2017 American Medical Association. All rights reserved. The codes documented in this report are preliminary and upon coder review may  be revised to meet current compliance requirements. Jonathon Bellows, MD Jonathon Bellows MD, MD 09/29/2017 10:17:06 AM This report has been signed electronically. Number of Addenda: 0 Note Initiated On: 09/29/2017 9:58 AM      [facility]

## 2017-09-29 NOTE — Transfer of Care (Signed)
Immediate Anesthesia Transfer of Care Note  Patient: Caitlin Ware  Procedure(s) Performed: ESOPHAGOGASTRODUODENOSCOPY (EGD) WITH PROPOFOL WITH DILATION (N/A )  Patient Location: PACU  Anesthesia Type:General  Level of Consciousness: sedated  Airway & Oxygen Therapy: Patient Spontanous Breathing and Patient connected to nasal cannula oxygen  Post-op Assessment: Report given to RN and Post -op Vital signs reviewed and stable  Post vital signs: Reviewed and stable  Last Vitals:  Vitals Value Taken Time  BP 114/68 09/29/2017 10:20 AM  Temp    Pulse 82 09/29/2017 10:20 AM  Resp 13 09/29/2017 10:20 AM  SpO2 96 % 09/29/2017 10:20 AM    Last Pain:  Vitals:   09/29/17 0935  TempSrc: Tympanic  PainSc: 0-No pain         Complications: No apparent anesthesia complications

## 2017-09-29 NOTE — H&P (Signed)
Jonathon Bellows, MD [address], [place], [place], [place], [address] [address], [place], [place], [place], [address] Phone: [phone]  Fax: [phone]  Primary Care Physician:  Glean Hess, MD   Pre-Procedure History & Physical: HPI:  Caitlin Ware is a 52 y.o. female is here for an endoscopy    Past Medical History:  Diagnosis Date  . Adopted   . Eczema   . GERD (gastroesophageal reflux disease)   . Headache    migraines  . Motion sickness    cars  . PONV (postoperative nausea and vomiting)   . Vertigo     Past Surgical History:  Procedure Laterality Date  . ANKLE SURGERY Right 2006  . AUGMENTATION MAMMAPLASTY  2010  . COLONOSCOPY WITH PROPOFOL N/A 02/08/2016   Procedure: COLONOSCOPY WITH PROPOFOL;  Surgeon: Lucilla Lame, MD;  Location: [facility];  Service: Endoscopy;  Laterality: N/A;  . HYSTEROSCOPY W/D&C N/A 09/25/2017   Procedure: DILATATION AND CURETTAGE /HYSTEROSCOPY;  Surgeon: Ward, Honor Loh, MD;  Location: [facility];  Service: Gynecology;  Laterality: N/A;  . POLYPECTOMY  02/08/2016   Procedure: POLYPECTOMY;  Surgeon: Lucilla Lame, MD;  Location: [facility];  Service: Endoscopy;;  . SHOULDER SURGERY Right 2000    Prior to Admission medications   Medication Sig Start Date End Date Taking? Authorizing Provider  Carboxymeth-Glyc-Polysorb PF (REFRESH OPTIVE MEGA-3) 0.5-1-0.5 % SOLN Place 1 drop into both eyes 2 (two) times daily.   Yes [provider]  Methylsulfonylmethane (MSM) POWD Take 1 Dose by mouth at bedtime.   Yes [provider]  naproxen sodium (ALEVE) 220 MG tablet Take 440 mg by mouth 2 (two) times daily as needed (for pain/headaches.).   Yes [provider]  Omega-3 Fatty Acids (FISH OIL PO) Take 2,000 mg by mouth daily. Liquid   Yes [provider]  omeprazole (PRILOSEC) 40 MG capsule Take 1 capsule (40 mg total) by mouth daily. 09/17/17  Yes Jonathon Bellows, MD  OnabotulinumtoxinA  (BOTOX IJ) Inject as directed. Administered ~ every 5 months for migraine prevention by practitioner in [facility] [provider]  [place] 28 0.25-35 MG-MCG tablet Take 1 tablet by mouth at bedtime. 07/23/17  Yes [provider]  SUMAtriptan (IMITREX) 100 MG tablet Take 1 tablet (100 mg total) by mouth every 2 (two) hours as needed for migraine. May repeat in 2 hours if headache persists or recurs. 12/31/15  Yes Glean Hess, MD    Allergies as of 09/17/2017  . (No Known Allergies)    Family History  Adopted: Yes  Family history unknown: Yes    Social History   Socioeconomic History  . Marital status: Married    Spouse name: Not on file  . Number of children: Not on file  . Years of education: Not on file  . Highest education level: Not on file  Occupational History  . Not on file  Social Needs  . Financial resource strain: Not on file  . Food insecurity:    Worry: Not on file    Inability: Not on file  . Transportation needs:    Medical: Not on file    Non-medical: Not on file  Tobacco Use  . Smoking status: Never Smoker  . Smokeless tobacco: Never Used  Substance and Sexual Activity  . Alcohol use: Yes    Alcohol/week: 4.2 oz    Types: 7 Glasses of wine per week    Comment: occ  .  Drug use: No  . Sexual activity: Yes    Birth control/protection: None  Lifestyle  . Physical activity:    Days per week: Not on file    Minutes per session: Not on file  . Stress: Not on file  Relationships  . Social connections:    Talks on phone: Not on file    Gets together: Not on file    Attends religious service: Not on file    Active member of club or organization: Not on file    Attends meetings of clubs or organizations: Not on file    Relationship status: Not on file  . Intimate partner violence:    Fear of current or ex partner: Not on file    Emotionally abused: Not on file    Physically abused: Not on file    Forced sexual activity: Not on  file  Other Topics Concern  . Not on file  Social History Narrative  . Not on file    Review of Systems: See HPI, otherwise negative ROS  Physical Exam: BP 135/83   Pulse 63   Temp (!) 97 F (36.1 C) (Tympanic)   Resp 20   Ht 5\' 2"  (1.575 m)   Wt 145 lb (65.8 kg)   SpO2 100%   BMI 26.52 kg/m  General:   Alert,  pleasant and cooperative in NAD Head:  Normocephalic and atraumatic. Neck:  Supple; no masses or thyromegaly. Lungs:  Clear throughout to auscultation, normal respiratory effort.    Heart:  +S1, +S2, Regular rate and rhythm, No edema. Abdomen:  Soft, nontender and nondistended. Normal bowel sounds, without guarding, and without rebound.   Neurologic:  Alert and  oriented x4;  grossly normal neurologically.  Impression/Plan: Caitlin Ware is here for an endoscopy and possible dilation   to be performed for  evaluation of dysphagia    Risks, benefits, limitations, and alternatives regarding endoscopy have been reviewed with the patient.  Questions have been answered.  All parties agreeable.   Jonathon Bellows, MD  09/29/2017, 9:59 AM

## 2017-09-29 NOTE — Anesthesia Post-op Follow-up Note (Signed)
Anesthesia QCDR form completed.        

## 2017-09-29 NOTE — Anesthesia Preprocedure Evaluation (Signed)
Anesthesia Evaluation  Patient identified by MRN, date of birth, ID band Patient awake    Reviewed: Allergy & Precautions, NPO status , Patient's Chart, lab work & pertinent test results  History of Anesthesia Complications (+) PONV and history of anesthetic complications  Airway Mallampati: II  TM Distance: >3 FB Neck ROM: Full    Dental no notable dental hx.    Pulmonary neg pulmonary ROS, neg sleep apnea, neg COPD,    breath sounds clear to auscultation- rhonchi (-) wheezing      Cardiovascular Exercise Tolerance: Good (-) hypertension(-) CAD, (-) Past MI, (-) Cardiac Stents and (-) CABG  Rhythm:Regular Rate:Normal - Systolic murmurs and - Diastolic murmurs    Neuro/Psych  Headaches, negative psych ROS   GI/Hepatic Neg liver ROS, GERD  ,  Endo/Other  negative endocrine ROSneg diabetes  Renal/GU negative Renal ROS     Musculoskeletal negative musculoskeletal ROS (+)   Abdominal (+) - obese,   Peds  Hematology negative hematology ROS (+)   Anesthesia Other Findings Past Medical History: No date: Adopted No date: Eczema No date: GERD (gastroesophageal reflux disease) No date: Headache     Comment:  migraines No date: Motion sickness     Comment:  cars No date: PONV (postoperative nausea and vomiting) No date: Vertigo   Reproductive/Obstetrics                             Anesthesia Physical  Anesthesia Plan  ASA: II  Anesthesia Plan: General   Post-op Pain Management:    Induction: Intravenous  PONV Risk Score and Plan: 3 and Propofol infusion  Airway Management Planned: Nasal Cannula  Additional Equipment:   Intra-op Plan:   Post-operative Plan:   Informed Consent: I have reviewed the patients History and Physical, chart, labs and discussed the procedure including the risks, benefits and alternatives for the proposed anesthesia with the patient or authorized  representative who has indicated his/her understanding and acceptance.   Dental advisory given  Plan Discussed with: CRNA and Anesthesiologist  Anesthesia Plan Comments:         Anesthesia Quick Evaluation

## 2017-09-29 NOTE — Anesthesia Postprocedure Evaluation (Signed)
Anesthesia Post Note  Patient: Caitlin Ware  Procedure(s) Performed: ESOPHAGOGASTRODUODENOSCOPY (EGD) WITH PROPOFOL WITH DILATION (N/A )  Patient location during evaluation: Endoscopy Anesthesia Type: General Level of consciousness: awake and alert Pain management: pain level controlled Vital Signs Assessment: post-procedure vital signs reviewed and stable Respiratory status: spontaneous breathing, nonlabored ventilation, respiratory function stable and patient connected to nasal cannula oxygen Cardiovascular status: blood pressure returned to baseline and stable Postop Assessment: no apparent nausea or vomiting Anesthetic complications: no     Last Vitals:  Vitals:   09/29/17 1015 09/29/17 1020  BP: 114/68 114/68  Pulse:  82  Resp: 18 13  Temp: (!) 36.1 C   SpO2: 96% 96%    Last Pain:  Vitals:   09/29/17 1040  TempSrc:   PainSc: 0-No pain                 Martha Clan

## 2017-09-30 ENCOUNTER — Encounter: Payer: Self-pay | Admitting: Gastroenterology

## 2017-09-30 LAB — SURGICAL PATHOLOGY

## 2017-10-01 ENCOUNTER — Encounter: Payer: Self-pay | Admitting: Internal Medicine

## 2017-10-01 NOTE — Telephone Encounter (Signed)
Patient question about labs. Please Advise.

## 2017-10-01 NOTE — Telephone Encounter (Signed)
Patient 2nd message about labs.

## 2017-10-22 ENCOUNTER — Encounter: Payer: Self-pay | Admitting: Gastroenterology

## 2018-02-19 ENCOUNTER — Telehealth: Payer: Self-pay | Admitting: Internal Medicine

## 2018-03-05 ENCOUNTER — Encounter: Payer: Managed Care, Other (non HMO) | Admitting: Internal Medicine

## 2018-06-29 IMAGING — MG MM  DIGITAL SCREENING BREAST BILAT IMPLANT W/ TOMO W/ CAD
8 of 16 series · 8 of 32 positions shown · non-contrast
Comparison: Previous exam(s).

CLINICAL DATA: Screening.

EXAM:
2D DIGITAL SCREENING BILATERAL MAMMOGRAM WITH IMPLANTS, CAD AND
ADJUNCT TOMO
The patient has retropectoral implants. Standard and implant
displaced views were performed.

[R MLO]
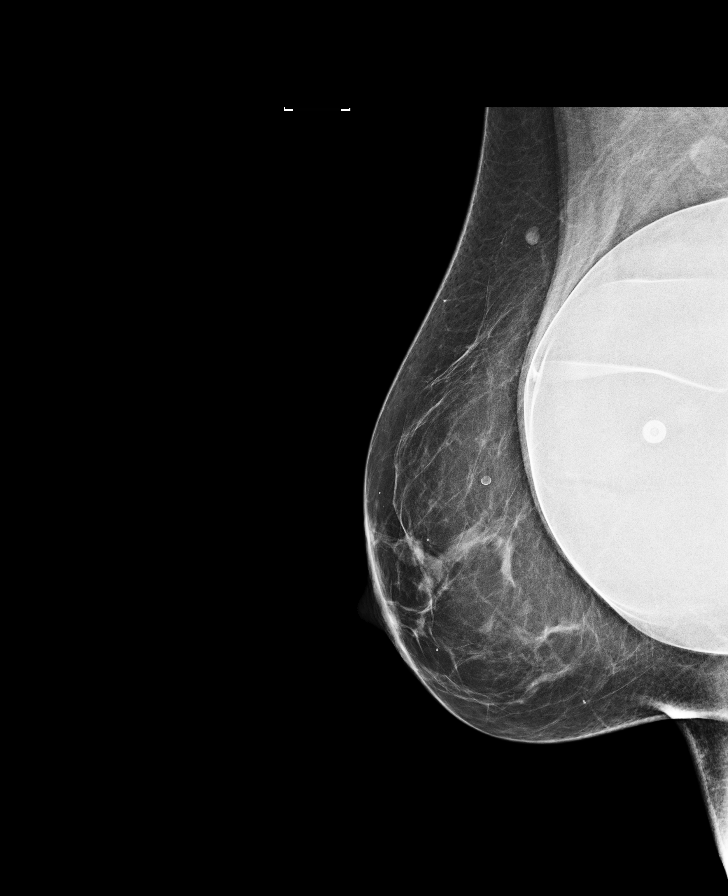

[R CC]
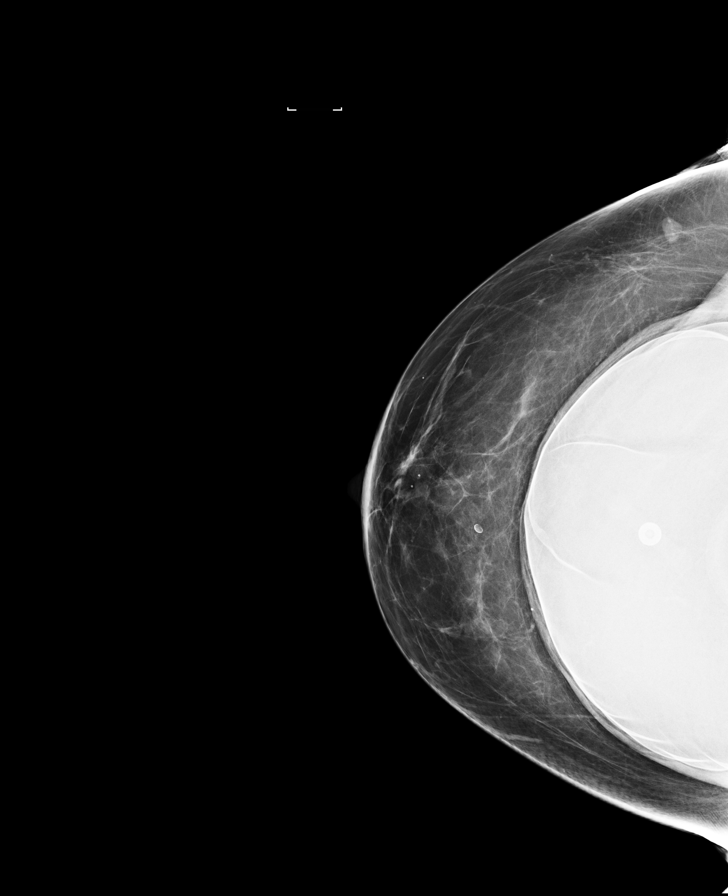

[L MLO (1 of 2)]
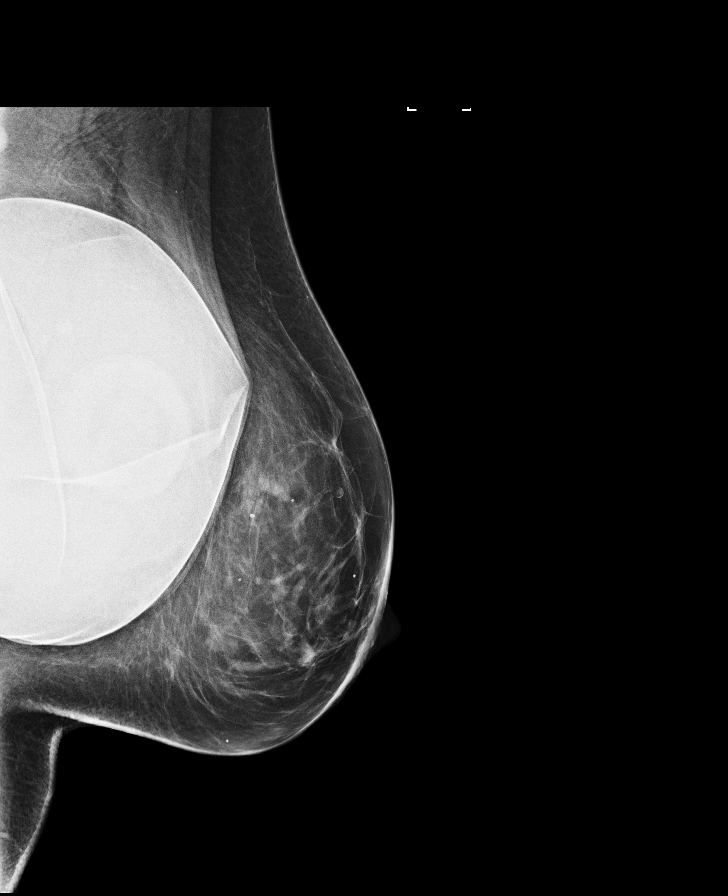

[L CC]
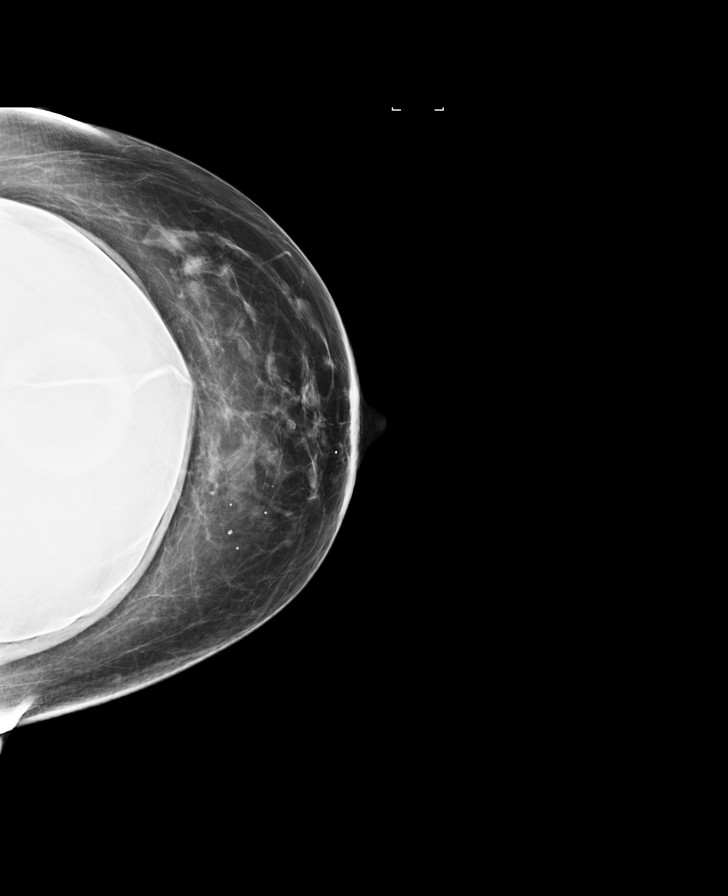

[R CC synth-2D]
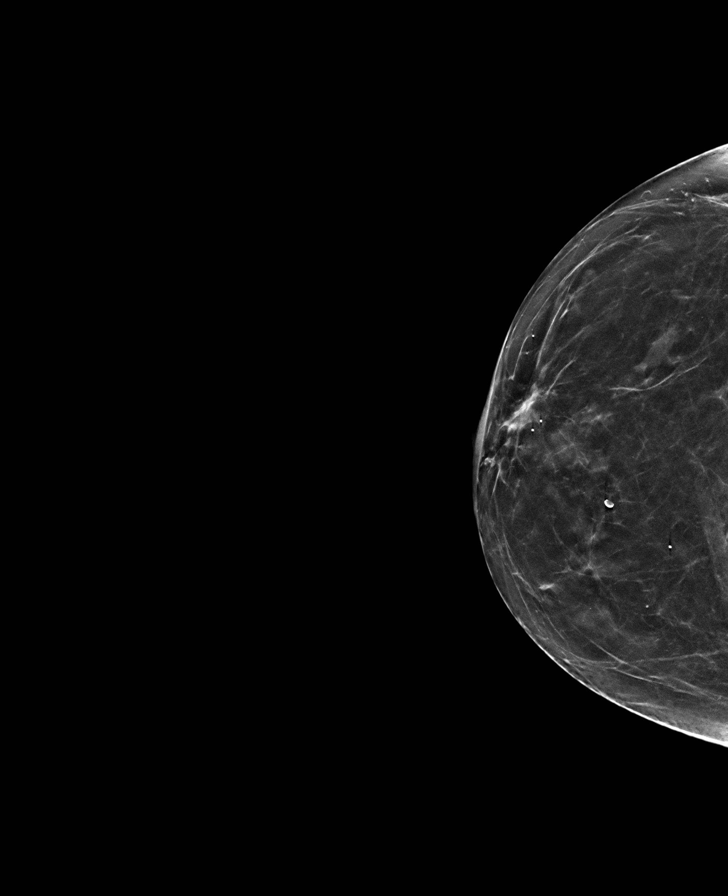

[L MLO (2 of 2)]
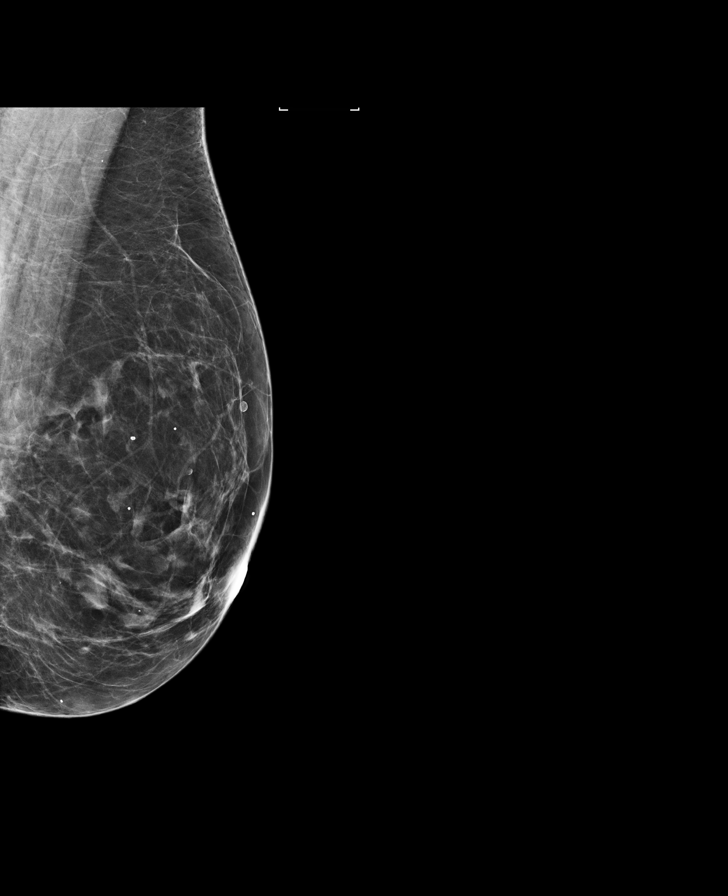

[L CC synth-2D]
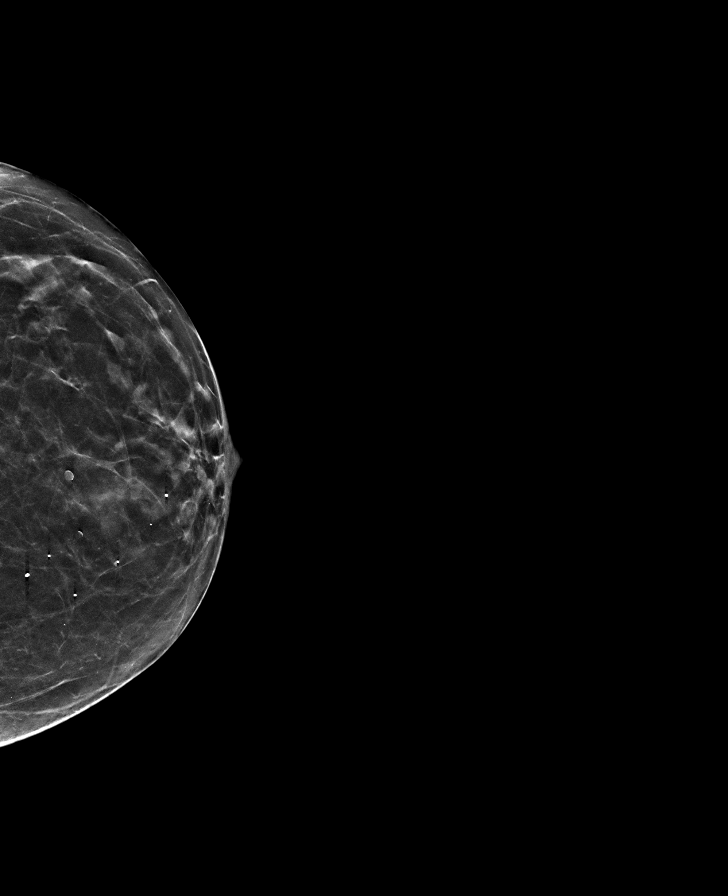

[R MLO synth-2D]
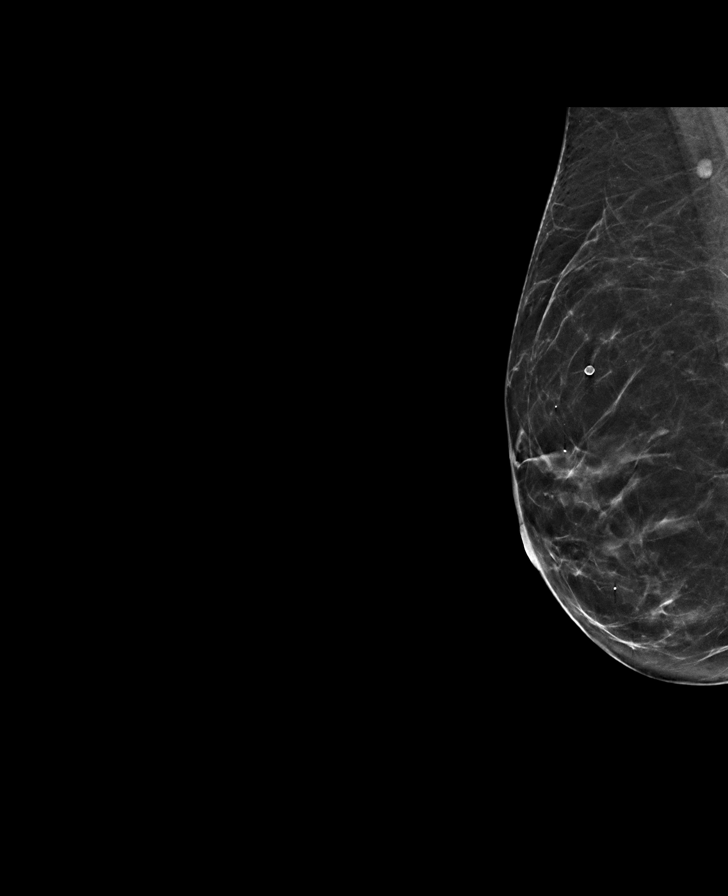

[8 of 32 positions shown; findings below may reference images not displayed]

ACR Breast Density Category b: There are scattered areas of
fibroglandular density.
FINDINGS: There are no findings suspicious for malignancy. Images were
processed with CAD.
IMPRESSION: No mammographic evidence of malignancy. A result letter of this
screening mammogram will be mailed directly to the patient.

RECOMMENDATION:
Screening mammogram in one year. (Code:H6-J-DBW)

BI-RADS CATEGORY  1:  Negative.

## 2019-01-04 ENCOUNTER — Other Ambulatory Visit: Payer: Self-pay

## 2019-01-04 ENCOUNTER — Ambulatory Visit
Admission: EM | Admit: 2019-01-04 | Discharge: 2019-01-04 | Disposition: A | Discharge status: Home / Self Care | Payer: Commercial Managed Care - PPO | Attending: Emergency Medicine | Admitting: Emergency Medicine

## 2019-01-04 ENCOUNTER — Encounter: Payer: Self-pay | Admitting: Emergency Medicine

## 2019-01-04 DIAGNOSIS — J029 Acute pharyngitis, unspecified: Secondary | ICD-10-CM

## 2019-01-04 DIAGNOSIS — Z7189 Other specified counseling: Secondary | ICD-10-CM

## 2019-01-04 DIAGNOSIS — R059 Cough, unspecified: Secondary | ICD-10-CM

## 2019-01-04 DIAGNOSIS — J302 Other seasonal allergic rhinitis: Secondary | ICD-10-CM | POA: Diagnosis not present

## 2019-01-04 DIAGNOSIS — R05 Cough: Secondary | ICD-10-CM | POA: Diagnosis not present

## 2019-01-04 LAB — RAPID STREP SCREEN (MED CTR MEBANE ONLY): Streptococcus, Group A Screen (Direct): NEGATIVE

## 2019-01-04 MED ORDER — ZYRTEC ALLERGY 10 MG PO TBDP: 10.0000 mg | ORAL_TABLET | Freq: Every day | ORAL | 0 refills | Status: DC

## 2019-01-04 NOTE — ED Provider Notes (Signed)
[place], [place]   Name: Caitlin Ware DOB: 05/27/1965 MRN: [id number] CSN: [id number] PCP: Glean Hess, MD  Arrival date and time:  01/04/19 [phone]  Chief Complaint:  Cough and Sore Throat   NOTE: Prior to seeing the patient today, I have reviewed the triage nursing documentation and vital signs. Clinical staff has updated patient's PMH/PSHx, current medication list, and drug allergies/intolerances to ensure comprehensive history available to assist in medical decision making.   History:   HPI: Caitlin Ware is a 53 y.o. female who presents today with complaints of cough and sore throat that began with acute onset this morning. Cough is reported to be non-productive. Patient denies any associated fevers. She denies any shortness of breath or changes to her sense of taste/smell. Patient has not experienced any nausea, vomiting, diarrhea, or myalgias. She notes that she feels a bit more fatigued this morning than she normally does. 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 Additionally, patient has just returned home from the beach where she spent the weekend. Patient was advised by [facility] pharmacy to present to urgent care for SARS-CoV-2 testing.   Past Medical History:  Diagnosis Date  . Adopted   . Eczema   . GERD (gastroesophageal reflux disease)   . Headache    migraines  . Motion sickness    cars  . PONV (postoperative nausea and vomiting)   . Vertigo     Past Surgical History:  Procedure Laterality Date  . ANKLE SURGERY Right 2006  . AUGMENTATION MAMMAPLASTY  2010  . COLONOSCOPY WITH PROPOFOL N/A 02/08/2016   Procedure: COLONOSCOPY WITH PROPOFOL;  Surgeon: Lucilla Lame, MD;  Location: [facility];  Service: Endoscopy;  Laterality: N/A;  . ESOPHAGOGASTRODUODENOSCOPY (EGD) WITH PROPOFOL N/A 09/29/2017   Procedure: ESOPHAGOGASTRODUODENOSCOPY (EGD) WITH  PROPOFOL WITH DILATION;  Surgeon: Jonathon Bellows, MD;  Location: [facility];  Service: Gastroenterology;  Laterality: N/A;  . HYSTEROSCOPY W/D&C N/A 09/25/2017   Procedure: DILATATION AND CURETTAGE /HYSTEROSCOPY;  Surgeon: Ward, Honor Loh, MD;  Location: [facility];  Service: Gynecology;  Laterality: N/A;  . POLYPECTOMY  02/08/2016   Procedure: POLYPECTOMY;  Surgeon: Lucilla Lame, MD;  Location: [facility];  Service: Endoscopy;;  . SHOULDER SURGERY Right 2000    Family History  Adopted: Yes  Family history unknown: Yes    Social History   Tobacco Use  . Smoking status: Never Smoker  . Smokeless tobacco: Never Used  Substance Use Topics  . Alcohol use: Yes    Alcohol/week: 7.0 standard drinks    Types: 7 Glasses of wine per week    Comment: occ  . Drug use: No    Patient Active Problem List   Diagnosis Date Noted  . DUB (dysfunctional uterine bleeding) 09/17/2017  . Special screening for malignant neoplasms, colon   . Benign neoplasm of cecum   . Migraine headache 09/11/2015  . GERD (gastroesophageal reflux disease) 09/11/2015  . Hyperlipidemia, mixed 09/11/2015  . Hx of abnormal cervical Papanicolaou smear 09/11/2015  . Vertigo 09/11/2015  . Vitamin D deficiency 09/11/2015    Home Medications:    Current Meds  Medication Sig  . dexlansoprazole (DEXILANT) 60 MG capsule Take 60 mg by mouth daily.  . Omega-3 Fatty Acids (FISH OIL PO) Take 2,000 mg by mouth daily. Liquid  . SUMAtriptan (IMITREX) 100 MG tablet Take 1 tablet (100 mg total) by mouth every 2 (  two) hours as needed for migraine. May repeat in 2 hours if headache persists or recurs.    Allergies:   Patient has no known allergies.  Review of Systems (ROS): Review of Systems  Constitutional: Positive for fatigue. Negative for fever.  HENT: Positive for sore throat. Negative for congestion, ear pain, postnasal drip, rhinorrhea, sinus pressure, sinus pain and sneezing.   Eyes: Negative for pain, discharge  and redness.  Respiratory: Positive for cough. Negative for chest tightness and shortness of breath.   Cardiovascular: Negative for chest pain and palpitations.  Gastrointestinal: Negative for abdominal pain, diarrhea, nausea and vomiting.  Musculoskeletal: Negative for arthralgias, back pain, myalgias and neck pain.  Skin: Negative for color change, pallor and rash.  Neurological: Negative for dizziness, syncope, weakness and headaches.  Hematological: Negative for adenopathy.     Vital Signs: Today's Vitals   01/04/19 0830 01/04/19 0832 01/04/19 0833 01/04/19 0928  BP: 132/82     Pulse: 81     Resp: 18     Temp: 98.6 F (37 C)     TempSrc: Oral     SpO2: 100%     Weight:   145 lb (65.8 kg)   Height:   5\' 2"  (1.575 m)   PainSc:  3   3     Physical Exam: Physical Exam  Constitutional: She is oriented to person, place, and time and well-developed, well-nourished, and in no distress. No distress.  HENT:  Head: Normocephalic and atraumatic.  Nose: Rhinorrhea present. No mucosal edema or sinus tenderness.  Mouth/Throat: Uvula is midline. Posterior oropharyngeal erythema present. No oropharyngeal exudate or posterior oropharyngeal edema.  Eyes: Pupils are equal, round, and reactive to light. Conjunctivae and EOM are normal.  Neck: Normal range of motion. Neck supple. No tracheal deviation present.  Cardiovascular: Normal rate, regular rhythm, normal heart sounds and intact distal pulses. Exam reveals no gallop and no friction rub.  No murmur heard. Pulmonary/Chest: Effort normal and breath sounds normal. No respiratory distress. She has no wheezes. She has no rales.  Abdominal: Soft. Bowel sounds are normal. She exhibits no distension. There is no abdominal tenderness.  Musculoskeletal: Normal range of motion.  Lymphadenopathy:    She has no cervical adenopathy.  Neurological: She is alert and oriented to person, place, and time. Gait normal. GCS score is 15.  Skin: Skin is warm  and dry. No rash noted. She is not diaphoretic.  Psychiatric: Mood, memory, affect and judgment normal.  Nursing note and vitals reviewed.   Urgent Care Treatments / Results:   LABS: PLEASE NOTE: all labs that were ordered this encounter are listed, however only abnormal results are displayed. Labs Reviewed  RAPID STREP SCREEN ([facility] ONLY)  NOVEL CORONAVIRUS, NAA (HOSPITAL ORDER, SEND-OUT TO REF LAB)  CULTURE, GROUP A STREP [facility])    EKG: -None  RADIOLOGY: No results found.  PROCEDURES: Procedures  MEDICATIONS RECEIVED THIS VISIT: Medications - No data to display  PERTINENT CLINICAL COURSE NOTES/UPDATES:   Initial Impression / Assessment and Plan / Urgent Care Course:  Pertinent labs & imaging results that were available during my care of the patient were personally reviewed by me and considered in my medical decision making (see lab/imaging section of note for values and interpretations).  KYRAN WHITTIER is a 53 y.o. female who presents to [facility] today with complaints of Cough and Sore Throat   Patient is well appearing overall in clinic today. She does not appear to be in  any acute distress. Presenting symptoms (see HPI) and exam as documented above. Patient presents with concerns related to SARS-CoV-2 (novel coronavirus). Discussed typical symptom constellation. Reviewed potential for infection with recent beach travel and work with the public. Given exposure and potential for infection, testing is reasonable. Patient collected SARS-CoV-2 swab via facility approved self collection process today under the supervision of certified clinical staff. Discussed variable turn around times associated with testing, as swabs are being processed at [facility], and have been taking as long as 7 days. She was advised to self quarantine, per [facility] DHHS guidelines, until negative results received.   Rapid strep (-); reflex culture sent. Symptoms mild and just began today.  Suspect allergy related. Patient asked to restart daily allergy medication; Rx sent for cetirizine per her request. Dicussed supportive care measures at home. Patient to rest and ensure adequate hydration (water). Recommended salt water gargles and lozenges to help soothe the throat. Patient may use APAP and/or IBU on an as needed basis for pain/fever.   Discussed follow up with primary care physician in 1 week for re-evaluation. I have reviewed the follow up and strict return precautions for any new or worsening symptoms. Patient is aware of symptoms that would be deemed urgent/emergent, and would thus require further evaluation either here or in the emergency department. At the time of discharge, she verbalized understanding and consent with the discharge plan as it was reviewed with her. All questions were fielded by provider and/or clinic staff prior to patient discharge.    Final Clinical Impressions / Urgent Care Diagnoses:   Final diagnoses:  Advice Given About Covid-19 Virus Infection  Sore throat  Cough  Seasonal allergies    New Prescriptions:  [place] Controlled Substance Registry consulted? Not Applicable  Meds ordered this encounter  Medications  . Cetirizine HCl (ZYRTEC ALLERGY) 10 MG TBDP    Sig: Take 10 mg by mouth daily.    Dispense:  30 tablet    Refill:  0    Recommended Follow up Care:  Patient encouraged to follow up with the following provider within the specified time frame, or sooner as dictated by the severity of her symptoms. As always, she was instructed that for any urgent/emergent care needs, she should seek care either here or in the emergency department for more immediate evaluation.  Follow-up Information    Glean Hess, MD In 1 week.   Specialty: Internal Medicine Contact information: [address] [place] [address] [phone]         NOTE: This note was prepared using Dragon dictation software along with smaller phrase  technology. Despite my best ability to proofread, there is the potential that transcriptional errors may still occur from this process, and are completely unintentional.    Karen Kitchens, NP 01/05/19 [phone]

## 2019-01-04 NOTE — ED Triage Notes (Signed)
Patient states she woke up this morning with a cough and sore throat. She is wanting to be tested for COVID.

## 2019-01-04 NOTE — Discharge Instructions (Addendum)
It was very nice seeing you today in clinic. Thank you for entrusting me with your care.   Salt water gargles and throat lozenges may help to soothe your throat. Stay hydrated. Add daily Zyrtec (prescription sent in).   You were tested for SARS-CoV-2 (novel coronavirus) today. Testing has been taking 3-7 days to result. Please self quarantine until negative test results have been received (per [place] DHHS guidelines).   Make arrangements to follow up with your regular doctor in 1 week for re-evaluation if not improving. If your symptoms/condition worsens, please seek follow up care either here or in the ER. Please remember, our [place] providers are "right here with you" when you need [place].   Again, it was my pleasure to take care of you today. Thank you for choosing our clinic. I hope that you start to feel better quickly.   Honor Loh, MSN, APRN, FNP-C, CEN Advanced Practice Provider [facility]

## 2019-01-05 LAB — NOVEL CORONAVIRUS, NAA (HOSP ORDER, SEND-OUT TO REF LAB; TAT 18-24 HRS): SARS-CoV-2, NAA: NOT DETECTED

## 2019-01-06 ENCOUNTER — Encounter (HOSPITAL_COMMUNITY): Payer: Self-pay

## 2019-01-06 LAB — CULTURE, GROUP A STREP (THRC)

## 2019-02-10 ENCOUNTER — Encounter: Payer: Self-pay | Admitting: Emergency Medicine

## 2019-02-10 ENCOUNTER — Ambulatory Visit
Admission: EM | Admit: 2019-02-10 | Discharge: 2019-02-10 | Disposition: A | Discharge status: Home / Self Care | Payer: Commercial Managed Care - PPO | Attending: Family Medicine | Admitting: Family Medicine

## 2019-02-10 ENCOUNTER — Other Ambulatory Visit: Payer: Self-pay

## 2019-02-10 DIAGNOSIS — Z20828 Contact with and (suspected) exposure to other viral communicable diseases: Secondary | ICD-10-CM

## 2019-02-10 DIAGNOSIS — Z20822 Contact with and (suspected) exposure to covid-19: Secondary | ICD-10-CM

## 2019-02-10 DIAGNOSIS — Z1159 Encounter for screening for other viral diseases: Secondary | ICD-10-CM

## 2019-02-10 NOTE — ED Triage Notes (Signed)
Pt presents to [place] for covid exposure. 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 She is asymptomatic.

## 2019-02-10 NOTE — ED Provider Notes (Signed)
[place], [place]   Name: MAEBY LUHRSEN DOB: [birth year] MRN: [id number]: [id number] PCP: Glean Hess, MD  Arrival date and time:  02/10/19 1137  Chief Complaint:  COVID testing   NOTE: Prior to seeing the patient today, I have reviewed the triage nursing documentation and vital signs. Clinical staff has updated patient's PMH/PSHx, current medication list, and drug allergies/intolerances to ensure comprehensive history available to assist in medical decision making.   History:   HPI: RAASHIDA MORANDO is a 53 y.o. female who presents today with complaints of recent direct exposure to SARS-CoV-2 (novel coronavirus). Known exposure reported to have occurred on during routine interaction with a colleague a [employer] in [place]. Patient advises that the person she was exposed to received their positive results yesterday. Her last exposure to this person occurred on Monday (02/07/2019). Patient presents today with no symptoms; no cough, fevers, or other symptoms commonly associated with SARS-CoV-2. She advises that she feels generally well. Patient presents for testing out of concern for her personal health. She adds that she is is being required to provide documentation of negative test results before she will be allowed to return to work.   Past Medical History:  Diagnosis Date   Adopted    Eczema    GERD (gastroesophageal reflux disease)    Headache    migraines   Motion sickness    cars   PONV (postoperative nausea and vomiting)    Vertigo     Past Surgical History:  Procedure Laterality Date   ANKLE SURGERY Right 2006   AUGMENTATION MAMMAPLASTY  2010   COLONOSCOPY WITH PROPOFOL N/A 02/08/2016   Procedure: COLONOSCOPY WITH PROPOFOL;  Surgeon: Lucilla Lame, MD;  Location: [facility];  Service: Endoscopy;  Laterality: N/A;   ESOPHAGOGASTRODUODENOSCOPY (EGD) WITH PROPOFOL N/A 09/29/2017   Procedure: ESOPHAGOGASTRODUODENOSCOPY (EGD) WITH PROPOFOL WITH  DILATION;  Surgeon: Jonathon Bellows, MD;  Location: [facility] ENDOSCOPY;  Service: Gastroenterology;  Laterality: N/A;   HYSTEROSCOPY W/D&C N/A 09/25/2017   Procedure: DILATATION AND CURETTAGE /HYSTEROSCOPY;  Surgeon: Ward, Honor Loh, MD;  Location: [facility];  Service: Gynecology;  Laterality: N/A;   POLYPECTOMY  02/08/2016   Procedure: POLYPECTOMY;  Surgeon: Lucilla Lame, MD;  Location: [place];  Service: Endoscopy;;   SHOULDER SURGERY Right 2000    Family History  Adopted: Yes  Family history unknown: Yes    Social History   Tobacco Use   Smoking status: Never Smoker   Smokeless tobacco: Never Used  Substance Use Topics   Alcohol use: Yes    Alcohol/week: 7.0 standard drinks    Types: 7 Glasses of wine per week    Comment: occ   Drug use: No    Patient Active Problem List   Diagnosis Date Noted   DUB (dysfunctional uterine bleeding) 09/17/2017   Special screening for malignant neoplasms, colon    Benign neoplasm of cecum    Migraine headache 09/11/2015   GERD (gastroesophageal reflux disease) 09/11/2015   Hyperlipidemia, mixed 09/11/2015   Hx of abnormal cervical Papanicolaou smear 09/11/2015   Vertigo 09/11/2015   Vitamin D deficiency 09/11/2015    Home Medications:    Current Meds  Medication Sig   Cetirizine HCl (ZYRTEC ALLERGY) 10 MG TBDP Take 10 mg by mouth daily.   dexlansoprazole (DEXILANT) 60 MG capsule Take 60 mg by mouth daily.   Omega-3 Fatty Acids (FISH OIL PO) Take 2,000 mg by mouth daily. Liquid   OnabotulinumtoxinA (BOTOX IJ) Inject as directed. Administered ~  every 5 months for migraine prevention by practitioner in [facility]   SUMAtriptan (IMITREX) 100 MG tablet Take 1 tablet (100 mg total) by mouth every 2 (two) hours as needed for migraine. May repeat in 2 hours if headache persists or recurs.    Allergies:   Patient has no known allergies.  Review of Systems (ROS): Review of Systems  Constitutional: Negative for fatigue  and fever.  HENT: Negative for congestion, ear pain, postnasal drip, rhinorrhea, sinus pressure, sinus pain, sneezing and sore throat.   Eyes: Negative for pain, discharge and redness.  Respiratory: Negative for cough, chest tightness and shortness of breath.   Cardiovascular: Negative for chest pain and palpitations.  Gastrointestinal: Negative for abdominal pain, diarrhea, nausea and vomiting.  Musculoskeletal: Negative for arthralgias, back pain, myalgias and neck pain.  Skin: Negative for color change, pallor and rash.  Neurological: Negative for dizziness, syncope, weakness and headaches.  Hematological: Negative for adenopathy.     Vital Signs: Today's Vitals   02/10/19 1144 02/10/19 1147 02/10/19 1154  BP:  119/82   Pulse:  82   Resp:  16   Temp:  98.4 F (36.9 C)   TempSrc:  Oral   SpO2:  99%   Weight: 148 lb (67.1 kg)    Height: 5\' 2"  (1.575 m)    PainSc: 0-No pain  0-No pain    Physical Exam: Physical Exam  Constitutional: She is oriented to person, place, and time and well-developed, well-nourished, and in no distress. No distress.  HENT:  Head: Normocephalic and atraumatic.  Nose: Nose normal.  Mouth/Throat: Oropharynx is clear and moist.  Eyes: Pupils are equal, round, and reactive to light. Conjunctivae and EOM are normal.  Neck: Normal range of motion. Neck supple. No tracheal deviation present.  Cardiovascular: Normal rate, regular rhythm, normal heart sounds and intact distal pulses. Exam reveals no gallop and no friction rub.  No murmur heard. Pulmonary/Chest: Effort normal and breath sounds normal. No respiratory distress. She has no wheezes. She has no rales.  Musculoskeletal: Normal range of motion.  Lymphadenopathy:    She has no cervical adenopathy.  Neurological: She is alert and oriented to person, place, and time. Gait normal.  Skin: Skin is warm and dry. No rash noted. She is not diaphoretic.  Psychiatric: Mood, memory, affect and judgment  normal.  Nursing note and vitals reviewed.   Urgent Care Treatments / Results:   LABS: PLEASE NOTE: all labs that were ordered this encounter are listed, however only abnormal results are displayed. Labs Reviewed  NOVEL CORONAVIRUS, NAA (HOSP ORDER, SEND-OUT TO REF LAB; TAT 18-24 HRS)    EKG: -None  RADIOLOGY: No results found.  PROCEDURES: Procedures  MEDICATIONS RECEIVED THIS VISIT: Medications - No data to display  PERTINENT CLINICAL COURSE NOTES/UPDATES:   Initial Impression / Assessment and Plan / Urgent Care Course:  Pertinent labs & imaging results that were available during my care of the patient were personally reviewed by me and considered in my medical decision making (see lab/imaging section of note for values and interpretations).  WAVER EICHSTADT is a 53 y.o. female who presents to [facility] today with complaints of exposure to SARS-CoV-2.   Patient overall well appearing and in no acute distress today in clinic. Presenting symptoms (see HPI) and exam as documented above. She presents following a direct exposure to SARS-CoV-2 (novel coronavirus). Discussed typical symptom constellation. Reviewed potential for infection with recent close contact. Given exposure and potential for infection, testing is  reasonable. SARS-CoV-2 swab collected by certified clinical staff. Discussed variable turn around times associated with testing, as swabs are being processed at [facility], and have been between 2-5 days to come back. She was advised to self quarantine, per [facility] DHHS guidelines, until negative results received.   Current clinical condition warrants patient being out of work in order to quarantine while waiting for testing results. She was provided with the appropriate documentation to provide to her place of employment that will allow for her to RTW on 02/12/2019 with no restrictions. RTW is contingent on her SARS-CoV-2 test results being reviewed as negative.     Discussed follow up with primary care physician should she develop any concerning symptoms. I have reviewed the follow up and strict return precautions for any new or worsening symptoms. Patient is aware of symptoms that would be deemed urgent/emergent, and would thus require further evaluation either here or in the emergency department. At the time of discharge, he verbalized understanding and consent with the discharge plan as it was reviewed with him. All questions were fielded by provider and/or clinic staff prior to patient discharge.     Final Clinical Impressions / Urgent Care Diagnoses:   Final diagnoses:  Exposure to Covid-19 Virus  Encounter for screening for other viral diseases    New Prescriptions:  [place] Controlled Substance Registry consulted? Not Applicable  No orders of the defined types were placed in this encounter.   Recommended Follow up Care:  Patient encouraged to follow up with the following provider within the specified time frame, or sooner as dictated by the severity of her symptoms. As always, she was instructed that for any urgent/emergent care needs, she should seek care either here or in the emergency department for more immediate evaluation.  Follow-up Information    Glean Hess, MD.   Specialty: Internal Medicine Why: As needed Contact information: [address] [place] [address] [phone]         NOTE: This note was prepared using Dragon dictation software along with smaller phrase technology. Despite my best ability to proofread, there is the potential that transcriptional errors may still occur from this process, and are completely unintentional.    Karen Kitchens, NP 02/11/19 1737

## 2019-02-10 NOTE — Discharge Instructions (Signed)
It was very nice seeing you today in clinic. Thank you for entrusting me with your care.  ° °You have been tested for SARS-CoV-2 (novel coronavirus) today. Testing results have been taking between 2 and 5 days. Please self quarantine, per [place] DHHS guidelines, until you have received negative test results.  ° °If you develop any symptoms or concerns, make arrangements to follow up with your regular doctor. If your symptoms are severe, please seek follow up care in the ER. Please remember, our [place] providers are "right here with you" when you need us.  ° °Again, it was my pleasure to take care of you today. Thank you for choosing our clinic. I hope that you start to feel better quickly.  ° °Shakiah Wester, MSN, APRN, FNP-C, CEN °Advanced Practice Provider ° [facility] °

## 2019-02-11 ENCOUNTER — Encounter (HOSPITAL_COMMUNITY): Payer: Self-pay

## 2019-02-11 LAB — NOVEL CORONAVIRUS, NAA (HOSP ORDER, SEND-OUT TO REF LAB; TAT 18-24 HRS): SARS-CoV-2, NAA: NOT DETECTED

## 2019-03-20 ENCOUNTER — Encounter: Payer: Self-pay | Admitting: Emergency Medicine

## 2019-03-20 ENCOUNTER — Other Ambulatory Visit: Payer: Self-pay

## 2019-03-20 ENCOUNTER — Ambulatory Visit
Admission: EM | Admit: 2019-03-20 | Discharge: 2019-03-20 | Disposition: A | Discharge status: Home / Self Care | Payer: Commercial Managed Care - PPO | Attending: Family Medicine | Admitting: Family Medicine

## 2019-03-20 DIAGNOSIS — Z20828 Contact with and (suspected) exposure to other viral communicable diseases: Secondary | ICD-10-CM

## 2019-03-20 DIAGNOSIS — J029 Acute pharyngitis, unspecified: Secondary | ICD-10-CM

## 2019-03-20 DIAGNOSIS — J069 Acute upper respiratory infection, unspecified: Secondary | ICD-10-CM | POA: Diagnosis not present

## 2019-03-20 DIAGNOSIS — Z7189 Other specified counseling: Secondary | ICD-10-CM | POA: Diagnosis not present

## 2019-03-20 DIAGNOSIS — H1031 Unspecified acute conjunctivitis, right eye: Secondary | ICD-10-CM | POA: Diagnosis not present

## 2019-03-20 DIAGNOSIS — R05 Cough: Secondary | ICD-10-CM | POA: Diagnosis not present

## 2019-03-20 DIAGNOSIS — Z20822 Contact with and (suspected) exposure to covid-19: Secondary | ICD-10-CM

## 2019-03-20 LAB — RAPID STREP SCREEN (MED CTR MEBANE ONLY): Streptococcus, Group A Screen (Direct): NEGATIVE

## 2019-03-20 MED ORDER — POLYMYXIN B-TRIMETHOPRIM 10000-0.1 UNIT/ML-% OP SOLN: 1.0000 [drp] | OPHTHALMIC | 0 refills | Status: DC

## 2019-03-20 NOTE — Discharge Instructions (Signed)
It was very nice seeing you today in clinic. Thank you for entrusting me with your care.   Rest and Increase fluid intake as much as possible. Water is always best, as sugar and caffeine containing fluids can cause you to become dehydrated.  Use eye drops as prescribed. Avoid rubbing and touching your eye.   You were tested for SARS-CoV-2 (novel coronavirus) today. Testing is performed by an outside lab ([facility]) and has variable turn around times ranging between 2-5 days. Current recommendations from the the CDC and [place] DHHS require that you remain out of work in order to quarantine at home until negative test results are have been received. In the event that your test results are positive, you will be contacted with further directives. These measures are being implemented out of an abundance of caution to prevent transmission and spread during the current SARS-CoV-2 pandemic.  Make arrangements to follow up with your regular doctor in 1 week for re-evaluation if not improving. If your symptoms/condition worsens, please seek follow up care either here or in the ER. Please remember, our [place] providers are "right here with you" when you need [place].   Again, it was my pleasure to take care of you today. Thank you for choosing our clinic. I hope that you start to feel better quickly.   Honor Loh, MSN, APRN, FNP-C, CEN Advanced Practice Provider [facility]

## 2019-03-20 NOTE — ED Triage Notes (Signed)
Patient c/o cough, sore throat and fever that started yesterday.  Patient denies fever this morning.

## 2019-03-20 NOTE — ED Provider Notes (Signed)
[place], [place]   Name: Caitlin Ware DOB: October 23, 1965 MRN: [id number] CSN: [id number] PCP: Glean Hess, MD  Arrival date and time:  03/20/19 1011  Chief Complaint:  Sore Throat, Fever, Cough, and Eye Problem   NOTE: Prior to seeing the patient today, I have reviewed the triage nursing documentation and vital signs. Clinical staff has updated patient's PMH/PSHx, current medication list, and drug allergies/intolerances to ensure comprehensive history available to assist in medical decision making.   History:   HPI: Caitlin Ware is a 53 y.o. female who presents today with complaints of cough, sore throat, fever, and RIGHT eye pain that started with acute onset yesterday. Fever was as high as 101 yesterday and went away without treatment. She has not had any fevers today. Patient's cough is reported to be non-productive. Patient with a mild sore throat that "really isn't that bad". She denies that she has experienced any nausea, vomiting, diarrhea, or abdominal pain. She is eating and drinking well. Patient denies any perceived alterations to her sense of taste or smell. Patient presents with concerns related to SARS-CoV-2 (novel coronavirus) citing that she attended a Halloween party on Friday night. She has recently learned that some of the people who attended the party have tested (+) for SARS-CoV-2. Patient presents for testing out of concern for her personal health. She adds that she is is being required to provide documentation of negative test results before she will be allowed to return to work. Despite her symptoms, patient has not taken any over the counter interventions to help improve/relieve her reported symptoms at home.   Additionally, patient with complaints of pain, redness, and purulent drainage from her RIGHT eye. She denies any injury to the eye. Patient does not exhibit excessive tearing. She has experienced yellow exudative crusting. Vision is minimally blurred. Patient  notes that eye irritation is not exacerbated by light (photophobia). Patient has not tried any over the counter eye drops or pain medications to help reduce/relieve her current symptoms.   Past Medical History:  Diagnosis Date  . Adopted   . Eczema   . GERD (gastroesophageal reflux disease)   . Headache    migraines  . Motion sickness    cars  . PONV (postoperative nausea and vomiting)   . Vertigo     Past Surgical History:  Procedure Laterality Date  . ANKLE SURGERY Right 2006  . AUGMENTATION MAMMAPLASTY  2010  . COLONOSCOPY WITH PROPOFOL N/A 02/08/2016   Procedure: COLONOSCOPY WITH PROPOFOL;  Surgeon: Lucilla Lame, MD;  Location: [place];  Service: Endoscopy;  Laterality: N/A;  . ESOPHAGOGASTRODUODENOSCOPY (EGD) WITH PROPOFOL N/A 09/29/2017   Procedure: ESOPHAGOGASTRODUODENOSCOPY (EGD) WITH PROPOFOL WITH DILATION;  Surgeon: Jonathon Bellows, MD;  Location: [facility]COPY;  Service: Gastroenterology;  Laterality: N/A;  . HYSTEROSCOPY W/D&C N/A 09/25/2017   Procedure: DILATATION AND CURETTAGE /HYSTEROSCOPY;  Surgeon: Ward, Honor Loh, MD;  Location: [facility];  Service: Gynecology;  Laterality: N/A;  . POLYPECTOMY  02/08/2016   Procedure: POLYPECTOMY;  Surgeon: Lucilla Lame, MD;  Location: [place];  Service: Endoscopy;;  . SHOULDER SURGERY Right 2000    Family History  Adopted: Yes  Problem Relation Age of Onset  . Diabetes Mother   . Heart disease Father     Social History   Tobacco Use  . Smoking status: Never Smoker  . Smokeless tobacco: Never Used  Substance Use Topics  . Alcohol use: Yes    Alcohol/week: 7.0 standard drinks  Types: 7 Glasses of wine per week    Comment: occ  . Drug use: No    Patient Active Problem List   Diagnosis Date Noted  . DUB (dysfunctional uterine bleeding) 09/17/2017  . Special screening for malignant neoplasms, colon   . Benign neoplasm of cecum   . Migraine headache 09/11/2015  . GERD (gastroesophageal reflux  disease) 09/11/2015  . Hyperlipidemia, mixed 09/11/2015  . Hx of abnormal cervical Papanicolaou smear 09/11/2015  . Vertigo 09/11/2015  . Vitamin D deficiency 09/11/2015    Home Medications:    Current Meds  Medication Sig  . dexlansoprazole (DEXILANT) 60 MG capsule Take 60 mg by mouth daily.  . Omega-3 Fatty Acids (FISH OIL PO) Take 2,000 mg by mouth daily. Liquid    Allergies:   Patient has no known allergies.  Review of Systems (ROS): Review of Systems  Constitutional: Positive for fever (Tmax 101). Negative for fatigue.  HENT: Positive for postnasal drip and sore throat. Negative for congestion, ear pain, rhinorrhea, sinus pressure, sinus pain and sneezing.   Eyes: Positive for pain, discharge, redness and visual disturbance.  Respiratory: Positive for cough. Negative for chest tightness and shortness of breath.   Cardiovascular: Negative for chest pain and palpitations.  Gastrointestinal: Negative for abdominal pain, diarrhea, nausea and vomiting.  Musculoskeletal: Negative for arthralgias, back pain, myalgias and neck pain.  Skin: Negative for color change, pallor and rash.  Neurological: Negative for dizziness, syncope, weakness and headaches.  Hematological: Negative for adenopathy.     Vital Signs: Today's Vitals   03/20/19 1022 03/20/19 1027 03/20/19 1052  BP:  (!) 139/92   Pulse:  62   Resp:  16   Temp:  98 F (36.7 C)   TempSrc:  Oral   SpO2:  100%   Weight: 150 lb (68 kg)    Height: 5\' 2"  (1.575 m)    PainSc: 4   4     Physical Exam: Physical Exam  Constitutional: She is oriented to person, place, and time and well-developed, well-nourished, and in no distress. No distress.  HENT:  Head: Normocephalic and atraumatic.  Right Ear: Tympanic membrane normal.  Left Ear: Tympanic membrane normal.  Nose: Nose normal.  Mouth/Throat: Uvula is midline and mucous membranes are normal. Posterior oropharyngeal erythema (+) clear PND present. No posterior  oropharyngeal edema.  Eyes: Pupils are equal, round, and reactive to light. EOM are normal. Right eye exhibits exudate. Right conjunctiva is injected.  Neck: Normal range of motion. Neck supple.  Cardiovascular: Normal rate, regular rhythm, normal heart sounds and intact distal pulses. Exam reveals no gallop and no friction rub.  No murmur heard. Pulmonary/Chest: Effort normal. No respiratory distress. She has no decreased breath sounds. She has no wheezes. She has no rhonchi. She has no rales.  Abdominal: Soft. Normal appearance and bowel sounds are normal. She exhibits no distension. There is no abdominal tenderness.  Musculoskeletal: Normal range of motion.  Neurological: She is alert and oriented to person, place, and time. Gait normal.  Skin: Skin is warm and dry. No rash noted. She is not diaphoretic.  Psychiatric: Mood, memory, affect and judgment normal.  Nursing note and vitals reviewed.   Urgent Care Treatments / Results:   LABS: PLEASE NOTE: all labs that were ordered this encounter are listed, however only abnormal results are displayed. Labs Reviewed  RAPID STREP SCREEN ([facility] [place] ONLY)  NOVEL CORONAVIRUS, NAA (HOSP ORDER, SEND-OUT TO REF LAB; TAT 18-24 HRS)  CULTURE, GROUP A  STREP [facility])    EKG: -None  RADIOLOGY: No results found.  PROCEDURES: Procedures  MEDICATIONS RECEIVED THIS VISIT: Medications - No data to display  PERTINENT CLINICAL COURSE NOTES/UPDATES:   Initial Impression / Assessment and Plan / Urgent Care Course:  Pertinent labs & imaging results that were available during my care of the patient were personally reviewed by me and considered in my medical decision making (see lab/imaging section of note for values and interpretations).  Caitlin Ware is a 53 y.o. female who presents to [facility] today with complaints of Sore Throat, Fever, Cough, and Eye Problem   Patient overall well appearing and in no acute distress today in  clinic. Presenting symptoms (see HPI) and exam as documented above. She presents with symptoms associated with SARS-CoV-2 (novel coronavirus). Discussed typical symptom constellation. Reviewed potential for infection and need for testing. Patient amenable to being tested. SARS-CoV-2 swab collected by certified clinical staff. Discussed variable turn around times associated with testing, as swabs are being processed at [facility], and have been taking between 2-5 days to come back. She was advised to self quarantine, per [facility] DHHS guidelines, until negative results received.   Rapid streptococcal throat swab (-); reflex culture sent. Symptoms consistent with acute viral illness with cough. Until ruled out with confirmatory lab testing, SARS-CoV-2 remains part of the differential. Her testing is pending at this time. I discussed with her that her symptoms are felt to be viral in nature, thus antibiotics would not offer her any relief or improve his symptoms any faster than conservative symptomatic management. Discussed supportive care measures at home during acute phase of illness. Patient to rest as much as possible. She was encouraged to ensure adequate hydration (water and ORS) to prevent dehydration and electrolyte derangements. Patient may use APAP and/or IBU on an as needed basis for pain/fever. May use OTC cough syrup or drops to help with cough as it is minimal.   Regarding her RIGHT eye. (+) pain, erythema, and exudative crusting noted. She confirms blurring of her vision. Will cover for bacterial conjunctivitis with a course of Polytrim ophthalmic gtts. Encouraged to avoid touching/rubbing eye. Cool compresses recommended to help soothe the eye. Patient to follow up with her eye provider for worsening symptoms.   Current clinical condition warrants patient being out of work in order to quarantine while waiting for testing results. She was provided with the appropriate documentation to provide to her place  of employment that will allow for her to RTW on 03/22/2019 with no restrictions. RTW is contingent on her SARS-CoV-2 test results being reviewed as negative.   Discussed follow up with primary care physician in 1 week for re-evaluation. I have reviewed the follow up and strict return precautions for any new or worsening symptoms. Patient is aware of symptoms that would be deemed urgent/emergent, and would thus require further evaluation either here or in the emergency department. At the time of discharge, she verbalized understanding and consent with the discharge plan as it was reviewed with her. All questions were fielded by provider and/or clinic staff prior to patient discharge.    Final Clinical Impressions / Urgent Care Diagnoses:   Final diagnoses:  Viral URI with cough  Acute bacterial conjunctivitis of right eye  Encounter for laboratory testing for COVID-19 virus  Advice given about COVID-19 virus infection    New Prescriptions:  [place] Controlled Substance Registry consulted? Not Applicable  Meds ordered this encounter  Medications  . trimethoprim-polymyxin b (POLYTRIM) ophthalmic  solution    Sig: Place 1 drop into the right eye every 4 (four) hours. X 5 days    Dispense:  10 mL    Refill:  0    Recommended Follow up Care:  Patient encouraged to follow up with the following provider within the specified time frame, or sooner as dictated by the severity of her symptoms. As always, she was instructed that for any urgent/emergent care needs, she should seek care either here or in the emergency department for more immediate evaluation.  Follow-up Information    Glean Hess, MD In 1 week.   Specialty: Internal Medicine Contact information: [address] [place] [address] [phone]         NOTE: This note was prepared using Dragon dictation software along with smaller phrase technology. Despite my best ability to proofread, there is the potential that  transcriptional errors may still occur from this process, and are completely unintentional.     Karen Kitchens, NP 03/21/19 1719

## 2019-03-21 LAB — NOVEL CORONAVIRUS, NAA (HOSP ORDER, SEND-OUT TO REF LAB; TAT 18-24 HRS): SARS-CoV-2, NAA: NOT DETECTED

## 2019-03-23 LAB — CULTURE, GROUP A STREP (THRC)

## 2019-07-24 ENCOUNTER — Encounter: Payer: Self-pay | Admitting: Emergency Medicine

## 2019-07-24 ENCOUNTER — Other Ambulatory Visit: Payer: Self-pay

## 2019-07-24 ENCOUNTER — Ambulatory Visit
Admission: EM | Admit: 2019-07-24 | Discharge: 2019-07-24 | Disposition: A | Discharge status: Home / Self Care | Payer: Commercial Managed Care - PPO

## 2019-07-24 DIAGNOSIS — S70252A Superficial foreign body, left hip, initial encounter: Secondary | ICD-10-CM | POA: Diagnosis not present

## 2019-07-24 DIAGNOSIS — W60XXXA Contact with nonvenomous plant thorns and spines and sharp leaves, initial encounter: Secondary | ICD-10-CM

## 2019-07-24 NOTE — ED Triage Notes (Signed)
Patient in today c/o left side injury and pain after running into a cactus plant yesterday. Patient tried to get some of the thorns out, but still thinks there are some there.

## 2019-07-24 NOTE — ED Provider Notes (Signed)
[place], [place]   Name: Caitlin Ware DOB: 10-Dec-1965 MRN: [id number] CSN: [id number] PCP: Glean Hess, MD  Arrival date and time:  07/24/19 1032  Chief Complaint:  Cactus injury   NOTE: Prior to seeing the patient today, I have reviewed the triage nursing documentation and vital signs. Clinical staff has updated patient's PMH/PSHx, current medication list, and drug allergies/intolerances to ensure comprehensive history available to assist in medical decision making.   History:   HPI: Caitlin Ware is a 54 y.o. female who presents today with complaints of pain in her LEFT hip since yesterday. Patient reports that she had some contractors at her home. She was speaking with them and walking down her side walk when she accidentally came into contact with a potted cactus that she had. Patient reports several thorns were embedded in the skin of her LEFT hip. She was able to remove several of the thorns with the use of forceps, however presents today out of concern for the possibility of further embedded thorns. She is also concerned about developing an infection. Patient denies any significant erythema, bleeding/drainage, or warmth. Patient denies fever, chills, or other systemic signs of infection; no hypotension, tachycardia, nausea, vomiting, or vertiginous symptoms. Despite her symptoms, patient has not used any over the counter interventions to help improve/relieve her reported symptoms at home.   Past Medical History:  Diagnosis Date  . Adopted   . Eczema   . GERD (gastroesophageal reflux disease)   . Headache    migraines  . Motion sickness    cars  . PONV (postoperative nausea and vomiting)   . Vertigo     Past Surgical History:  Procedure Laterality Date  . ANKLE SURGERY Right 2006  . AUGMENTATION MAMMAPLASTY  2010  . COLONOSCOPY WITH PROPOFOL N/A 02/08/2016   Procedure: COLONOSCOPY WITH PROPOFOL;  Surgeon: Lucilla Lame, MD;  Location: [place];  Service:  Endoscopy;  Laterality: N/A;  . ESOPHAGOGASTRODUODENOSCOPY (EGD) WITH PROPOFOL N/A 09/29/2017   Procedure: ESOPHAGOGASTRODUODENOSCOPY (EGD) WITH PROPOFOL WITH DILATION;  Surgeon: Jonathon Bellows, MD;  Location: [facility] ENDOSCOPY;  Service: Gastroenterology;  Laterality: N/A;  . HYSTEROSCOPY WITH D & C N/A 09/25/2017   Procedure: DILATATION AND CURETTAGE /HYSTEROSCOPY;  Surgeon: Ward, Honor Loh, MD;  Location: [facility];  Service: Gynecology;  Laterality: N/A;  . POLYPECTOMY  02/08/2016   Procedure: POLYPECTOMY;  Surgeon: Lucilla Lame, MD;  Location: [place];  Service: Endoscopy;;  . SHOULDER SURGERY Right 2000    Family History  Adopted: Yes  Problem Relation Age of Onset  . Diabetes Mother   . Heart disease Father     Social History   Tobacco Use  . Smoking status: Never Smoker  . Smokeless tobacco: Never Used  Substance Use Topics  . Alcohol use: Not Currently    Alcohol/week: 7.0 standard drinks    Types: 7 Glasses of wine per week    Comment: occ  . Drug use: No    Patient Active Problem List   Diagnosis Date Noted  . DUB (dysfunctional uterine bleeding) 09/17/2017  . Special screening for malignant neoplasms, colon   . Benign neoplasm of cecum   . Migraine headache 09/11/2015  . GERD (gastroesophageal reflux disease) 09/11/2015  . Hyperlipidemia, mixed 09/11/2015  . Hx of abnormal cervical Papanicolaou smear 09/11/2015  . Vertigo 09/11/2015  . Vitamin D deficiency 09/11/2015    Home Medications:    Current Meds  Medication Sig  . bimatoprost (LATISSE) 0.03 % ophthalmic solution  APPLY TO THE LASH LINE ONCE DAILY  . dexlansoprazole (DEXILANT) 60 MG capsule Take 60 mg by mouth daily.  . Omega-3 Fatty Acids (FISH OIL PO) Take 2,000 mg by mouth daily. Liquid  . OnabotulinumtoxinA (BOTOX IJ) Inject as directed. Administered ~ every 5 months for migraine prevention by practitioner in [place]  . SUMAtriptan (IMITREX) 100 MG tablet Take 1 tablet (100 mg total) by mouth  every 2 (two) hours as needed for migraine. May repeat in 2 hours if headache persists or recurs.    Allergies:   No known allergies  Review of Systems (ROS):  Review of systems NEGATIVE unless otherwise noted in narrative H&P section.   Vital Signs: Today's Vitals   07/24/19 1044 07/24/19 1045 07/24/19 1046 07/24/19 1121  BP: (!) 144/82     Pulse: 64     Resp: 18     Temp: 98.1 F (36.7 C)     TempSrc: Oral     SpO2: 100%     Weight:   145 lb (65.8 kg)   Height:   5\' 2"  (1.575 m)   PainSc:  3   3     Physical Exam: Physical Exam  Constitutional: She is oriented to person, place, and time and well-developed, well-nourished, and in no distress.  HENT:  Head: Normocephalic and atraumatic.  Eyes: Pupils are equal, round, and reactive to light.  Cardiovascular: Normal rate.  Pulmonary/Chest: Effort normal. No respiratory distress.  Neurological: She is alert and oriented to person, place, and time. Gait normal.  Skin: Skin is warm and dry. No rash noted. She is not diaphoretic. There is erythema.  Multiple areas of punctate erythema to LEFT hip extending around to buttock. No drainage or warmth; no cellulitic changes. 4 retained cactus thorns visible.   Psychiatric: Mood, memory, affect and judgment normal.  Nursing note and vitals reviewed.   Urgent Care Treatments / Results:   Orders Placed This Encounter  Procedures  . ED FOREIGN BODY REMOVAL    LABS: PLEASE NOTE: all labs that were ordered this encounter are listed, however only abnormal results are displayed. Labs Reviewed - No data to display  EKG: -None  RADIOLOGY: No results found.  PROCEDURES: Foreign Body Removal Performed by: Karen Kitchens, NP Authorized by: Karen Kitchens, NP   Consent:    Consent obtained:  Verbal   Consent given by:  Patient   Risks discussed:  Bleeding, infection, pain and incomplete removal   Alternatives discussed:  No treatment, alternative treatment, observation and  delayed treatment Location:    Location: LEFT lateral hip extending around to buttock.   Depth:  Intradermal Pre-procedure details:    Imaging:  None   Neurovascular status: intact   Anesthesia (see MAR for exact dosages):    Anesthesia method:  None Procedure type:    Procedure complexity:  Simple Procedure details:    Localization method:  Visualized   Dissection of underlying tissues: no     Removal mechanism:  Forceps   Foreign bodies recovered:  4   Description:  Small cactus thorns   Intact foreign body removal: yes   Post-procedure details:    Neurovascular status: intact     Confirmation:  No additional foreign bodies on visualization   Skin closure:  None   Dressing:  Antibiotic ointment and adhesive bandage   Patient tolerance of procedure:  Tolerated well, no immediate complications   MEDICATIONS RECEIVED THIS VISIT: Medications - No data to display  PERTINENT CLINICAL COURSE  NOTES/UPDATES:   Initial Impression / Assessment and Plan / Urgent Care Course:  Pertinent labs & imaging results that were available during my care of the patient were personally reviewed by me and considered in my medical decision making (see lab/imaging section of note for values and interpretations).  KEISHAWNA SCHUL is a 54 y.o. female who presents to [facility] today with complaints of Cactus injury  Patient is well appearing overall in clinic today. She does not appear to be in any acute distress. Presenting symptoms (see HPI) and exam as documented above. Injury occurred yesterday. Patient has removed several thorns from her hip. She presents with concerns of retained foreign bodies in her skin and wanting to discuss possibility of infection. Skin cleansed. Foreign bodies x 4 removes as per above procedure note. Patient educated on need for daily wound care. She was encouraged to keep wound clean and dry. She was instructed to apply antibiotic (bacitracin or TAO) ointment 1-2 times  daily. Patient to monitor for signs and symptoms of infection, which would include increased redness, swelling, streaking, drainage, pain, and the development of a fever.    Discussed follow up with primary care physician in 1 week for re-evaluation. I have reviewed the follow up and strict return precautions for any new or worsening symptoms. Patient is aware of symptoms that would be deemed urgent/emergent, and would thus require further evaluation either here or in the emergency department. At the time of discharge, she verbalized understanding and consent with the discharge plan as it was reviewed with her. All questions were fielded by provider and/or clinic staff prior to patient discharge.    Final Clinical Impressions / Urgent Care Diagnoses:   Final diagnoses:  Foreign body of skin of hip, left, initial encounter  Contact with nonvenomous plant thorns and spines and sharp leaves, initial encounter    New Prescriptions:  [place] Controlled Substance Registry consulted? Not Applicable  No orders of the defined types were placed in this encounter.   Recommended Follow up Care:  Patient encouraged to follow up with the following provider within the specified time frame, or sooner as dictated by the severity of her symptoms. As always, she was instructed that for any urgent/emergent care needs, she should seek care either here or in the emergency department for more immediate evaluation.  Follow-up Information    Glean Hess, MD In 1 week.   Specialty: Internal Medicine Why: General reassessment of symptoms if not improving Contact information: [address] [place] [address] [phone]         NOTE: This note was prepared using Dragon dictation software along with smaller phrase technology. Despite my best ability to proofread, there is the potential that transcriptional errors may still occur from this process, and are completely unintentional.    Karen Kitchens, NP 07/25/19 0107

## 2019-07-24 NOTE — Discharge Instructions (Addendum)
It was very nice seeing you today in clinic. Thank you for entrusting me with your care.   4 small foreign bodies (cactus thorns removed). Keep areas clean and dry. Apply Bactroban ointment daily. Monitor for signs and symptoms of infection, which would include increased redness, swelling, streaking, drainage, pain, and the development of a fever. Call us back with concerns.   Make arrangements to follow up with your regular doctor in 1 week for re-evaluation if not improving. If your symptoms/condition worsens, please seek follow up care either here or in the ER. Please remember, our [place] providers are "right here with you" when you need [place].   Again, it was my pleasure to take care of you today. Thank you for choosing our clinic. I hope that you start to feel better quickly.   Honor Loh, MSN, APRN, FNP-C, CEN Advanced Practice Provider [facility]

## 2020-01-13 ENCOUNTER — Other Ambulatory Visit: Payer: Self-pay

## 2020-01-13 ENCOUNTER — Encounter: Payer: Self-pay | Admitting: Emergency Medicine

## 2020-01-13 ENCOUNTER — Ambulatory Visit
Admission: EM | Admit: 2020-01-13 | Discharge: 2020-01-13 | Disposition: A | Discharge status: Home / Self Care | Payer: Commercial Managed Care - PPO | Attending: Physician Assistant | Admitting: Physician Assistant

## 2020-01-13 DIAGNOSIS — H60333 Swimmer's ear, bilateral: Secondary | ICD-10-CM | POA: Diagnosis not present

## 2020-01-13 MED ORDER — OFLOXACIN 0.3 % OT SOLN: 10.0000 [drp] | Freq: Every day | OTIC | 1 refills | Status: AC

## 2020-01-13 NOTE — ED Triage Notes (Signed)
Patient c/o bilateral ear pain and fullness that started on Wed.  Patient denies fevers.

## 2020-01-13 NOTE — Discharge Instructions (Signed)
Your ear pain is consistent with swimmer's ear.  Use eardrops as prescribed.  May also take Tylenol or Motrin for pain relief.  You can try warm compresses as well.  Try not to get the ear wet.  Follow-up as needed if not feeling better over the next few days.

## 2020-01-13 NOTE — ED Provider Notes (Signed)
[facility]    CSN: [id number] Arrival date & time: 01/13/20  4098      History   Chief Complaint Chief Complaint  Patient presents with  . Otalgia    HPI Caitlin Ware is a 54 y.o. female.   54 year old female presents for bilateral ear pain x3 days.  She says the right side is worse than the left and hurts when she touches the outer ear.  She denies drainage from the ear.  She says it feels like the ears are full.  Reports recently returning from [place] where she did get into the ocean.  Patient denies fever, cough, congestion, sore throat.  She denies known Covid exposure or concern for Covid.  She has not treated the condition in any way on her own.  No other concerns today     Past Medical History:  Diagnosis Date  . Adopted   . Eczema   . GERD (gastroesophageal reflux disease)   . Headache    migraines  . Motion sickness    cars  . PONV (postoperative nausea and vomiting)   . Vertigo     Patient Active Problem List   Diagnosis Date Noted  . DUB (dysfunctional uterine bleeding) 09/17/2017  . Special screening for malignant neoplasms, colon   . Benign neoplasm of cecum   . Migraine headache 09/11/2015  . GERD (gastroesophageal reflux disease) 09/11/2015  . Hyperlipidemia, mixed 09/11/2015  . Hx of abnormal cervical Papanicolaou smear 09/11/2015  . Vertigo 09/11/2015  . Vitamin D deficiency 09/11/2015    Past Surgical History:  Procedure Laterality Date  . ANKLE SURGERY Right 2006  . AUGMENTATION MAMMAPLASTY  2010  . COLONOSCOPY WITH PROPOFOL N/A 02/08/2016   Procedure: COLONOSCOPY WITH PROPOFOL;  Surgeon: Lucilla Lame, MD;  Location: [place];  Service: Endoscopy;  Laterality: N/A;  . ESOPHAGOGASTRODUODENOSCOPY (EGD) WITH PROPOFOL N/A 09/29/2017   Procedure: ESOPHAGOGASTRODUODENOSCOPY (EGD) WITH PROPOFOL WITH DILATION;  Surgeon: Jonathon Bellows, MD;  Location: [facility] END[facility]COPY;  Service: Gastroenterology;  Laterality: N/A;  . HYSTEROSCOPY  WITH D & C N/A 09/25/2017   Procedure: DILATATION AND CURETTAGE /HYSTEROSCOPY;  Surgeon: Ward, Honor Loh, MD;  Location: [facility];  Service: Gynecology;  Laterality: N/A;  . POLYPECTOMY  02/08/2016   Procedure: POLYPECTOMY;  Surgeon: Lucilla Lame, MD;  Location: [place];  Service: Endoscopy;;  . SHOULDER SURGERY Right 2000    OB History   No obstetric history on file.      Home Medications    Prior to Admission medications   Medication Sig Start Date End Date Taking? Authorizing Provider  bimatoprost (LATISSE) 0.03 % ophthalmic solution APPLY TO THE LASH LINE ONCE DAILY 06/21/19  Yes [provider]  dexlansoprazole (DEXILANT) 60 MG capsule Take 60 mg by mouth daily.   Yes [provider]  SUMAtriptan (IMITREX) 100 MG tablet Take 1 tablet (100 mg total) by mouth every 2 (two) hours as needed for migraine. May repeat in 2 hours if headache persists or recurs. 12/31/15  Yes Glean Hess, MD  ofloxacin (FLOXIN) 0.3 % OTIC solution Place 10 drops into both ears daily for 7 days. 01/13/20 01/20/20  Laurene Footman B, PA-C  Omega-3 Fatty Acids (FISH OIL PO) Take 2,000 mg by mouth daily. Liquid    [provider]  OnabotulinumtoxinA (BOTOX IJ) Inject as directed. Administered ~ every 5 months for migraine prevention by practitioner in [facility]    [provider]  Cetirizine HCl (ZYRTEC ALLERGY) 10 MG TBDP  Take 10 mg by mouth daily. 01/04/19 07/24/19  Karen Kitchens, NP  omeprazole (PRILOSEC) 40 MG capsule Take 1 capsule (40 mg total) by mouth daily. 09/17/17 01/04/19  Jonathon Bellows, MD  SPRINTEC 28 0.25-35 MG-MCG tablet Take 1 tablet by mouth at bedtime. 07/23/17 01/04/19  [provider]    Family History Family History  Adopted: Yes  Problem Relation Age of Onset  . Diabetes Mother   . Heart disease Father     Social History Social History   Tobacco Use  . Smoking status: Never Smoker  . Smokeless tobacco: Never Used  Vaping Use  . Vaping  Use: Never used  Substance Use Topics  . Alcohol use: Not Currently    Alcohol/week: 7.0 standard drinks    Types: 7 Glasses of wine per week    Comment: occ  . Drug use: No     Allergies   No known allergies   Review of Systems Review of Systems  Constitutional: Positive for fatigue. Negative for fever.  HENT: Positive for ear pain. Negative for ear discharge, hearing loss, rhinorrhea, sinus pressure and sore throat.   Respiratory: Negative for cough and shortness of breath.   Gastrointestinal: Negative for vomiting.  Musculoskeletal: Negative for myalgias.  Neurological: Negative for weakness and headaches.  Hematological: Negative for adenopathy.     Physical Exam Triage Vital Signs ED Triage Vitals  Enc Vitals Group     BP 01/13/20 0944 (!) 142/94     Pulse Rate 01/13/20 0944 76     Resp 01/13/20 0944 14     Temp 01/13/20 0944 97.8 F (36.6 C)     Temp Source 01/13/20 0944 Oral     SpO2 01/13/20 0944 100 %     Weight 01/13/20 0941 150 lb (68 kg)     Height 01/13/20 0941 5\' 2"  (1.575 m)     Head Circumference --      Peak Flow --      Pain Score 01/13/20 0941 5     Pain Loc --      Pain Edu? --      Excl. in [place]? --    No data found.  Updated Vital Signs BP (!) 142/94 (BP Location: Right Arm)   Pulse 76   Temp 97.8 F (36.6 C) (Oral)   Resp 14   Ht 5\' 2"  (1.575 m)   Wt 150 lb (68 kg)   SpO2 100%   BMI 27.44 kg/m       Physical Exam Vitals and nursing note reviewed.  Constitutional:      General: She is not in acute distress.    Appearance: Normal appearance. She is not ill-appearing or toxic-appearing.  HENT:     Head: Normocephalic and atraumatic.     Right Ear: Hearing and tympanic membrane normal. Drainage (scant purulent material in EAC), swelling and tenderness present.     Left Ear: Hearing and tympanic membrane normal. Drainage (mild swelling and erythema of EAC), swelling (mild swelling and erythema left EAC) and tenderness present.      Nose: Nose normal.     Mouth/Throat:     Mouth: Mucous membranes are moist.     Pharynx: Oropharynx is clear.  Eyes:     General: No scleral icterus.       Right eye: No discharge.        Left eye: No discharge.     Conjunctiva/sclera: Conjunctivae normal.  Cardiovascular:     Rate and Rhythm:  Normal rate and regular rhythm.     Heart sounds: Normal heart sounds.  Pulmonary:     Effort: Pulmonary effort is normal. No respiratory distress.     Breath sounds: Normal breath sounds.  Musculoskeletal:     Cervical back: Neck supple.  Skin:    General: Skin is dry.  Neurological:     General: No focal deficit present.     Mental Status: She is alert. Mental status is at baseline.     Motor: No weakness.     Gait: Gait normal.  Psychiatric:        Mood and Affect: Mood normal.        Behavior: Behavior normal.        Thought Content: Thought content normal.      UC Treatments / Results  Labs (all labs ordered are listed, but only abnormal results are displayed) Labs Reviewed - No data to display  EKG   Radiology No results found.  Procedures Procedures (including critical care time)  Medications Ordered in UC Medications - No data to display  Initial Impression / Assessment and Plan / UC Course  I have reviewed the triage vital signs and the nursing notes.  Pertinent labs & imaging results that were available during my care of the patient were reviewed by me and considered in my medical decision making (see chart for details).    Exam consistent with acute bilateral otitis externa.  Patient has no allergies prescribed ofloxacin eardrops.  Also advised Tylenol Motrin for pain relief and warm compresses.  Follow-up as needed  Final Clinical Impressions(s) / UC Diagnoses   Final diagnoses:  Acute swimmer's ear of both sides     Discharge Instructions     Your ear pain is consistent with swimmer's ear.  Use eardrops as prescribed.  May also take Tylenol or  Motrin for pain relief.  You can try warm compresses as well.  Try not to get the ear wet.  Follow-up as needed if not feeling better over the next few days.    ED Prescriptions    Medication Sig Dispense Auth. Provider   ofloxacin (FLOXIN) 0.3 % OTIC solution Place 10 drops into both ears daily for 7 days. 5 mL Danton Clap, PA-C     PDMP not reviewed this encounter.   Danton Clap, PA-C 01/13/20 1008

## 2020-03-09 ENCOUNTER — Other Ambulatory Visit: Payer: Self-pay

## 2020-03-09 ENCOUNTER — Emergency Department
Admission: EM | Admit: 2020-03-09 | Discharge: 2020-03-10 | Disposition: A | Discharge status: Home / Self Care | Payer: Commercial Managed Care - PPO | Attending: Emergency Medicine | Admitting: Emergency Medicine

## 2020-03-09 ENCOUNTER — Encounter: Payer: Self-pay | Admitting: Emergency Medicine

## 2020-03-09 DIAGNOSIS — L03211 Cellulitis of face: Secondary | ICD-10-CM | POA: Insufficient documentation

## 2020-03-09 NOTE — ED Triage Notes (Signed)
Patient states that she was stung 6 days ago. Patient states that she has been having a reaction over the past 6 days. Patient states that about an hour she started having swelling to her face and trouble breathing.

## 2020-03-09 NOTE — ED Provider Notes (Signed)
[facility] Emergency Department Provider Note   ____________________________________________   I have reviewed the triage vital signs and the nursing notes.   HISTORY  Chief Complaint Face swelling  History limited by: Not Limited   HPI Caitlin Ware is a 54 y.o. female who presents to the emergency department today because of concern for face swelling. The patient states that she was stung by a yellow jacket roughly one week ago over her right eyebrow. She states she had immediate swelling and some throat tightening at that time which then resolved in roughly four hours. Since then she has felt some intermittent symptoms, but came in tonight because of right sided facial swelling, some left facial swelling and some throat tightening. The patient denies any history of bee stings in the past. She states she has had bad reactions to mosquito and ant bites in the past.    Records reviewed. Per medical record review patient has a history of GERD, migraines. Also seen in the ED and diagnosed with seasonal allergies.   Past Medical History:  Diagnosis Date  . Adopted   . Eczema   . GERD (gastroesophageal reflux disease)   . Headache    migraines  . Motion sickness    cars  . PONV (postoperative nausea and vomiting)   . Vertigo     Patient Active Problem List   Diagnosis Date Noted  . DUB (dysfunctional uterine bleeding) 09/17/2017  . Special screening for malignant neoplasms, colon   . Benign neoplasm of cecum   . Migraine headache 09/11/2015  . GERD (gastroesophageal reflux disease) 09/11/2015  . Hyperlipidemia, mixed 09/11/2015  . Hx of abnormal cervical Papanicolaou smear 09/11/2015  . Vertigo 09/11/2015  . Vitamin D deficiency 09/11/2015    Past Surgical History:  Procedure Laterality Date  . ANKLE SURGERY Right 2006  . AUGMENTATION MAMMAPLASTY  2010  . COLONOSCOPY WITH PROPOFOL N/A 02/08/2016   Procedure: COLONOSCOPY WITH PROPOFOL;   Surgeon: Lucilla Lame, MD;  Location: [place];  Service: Endoscopy;  Laterality: N/A;  . ESOPHAGOGASTRODUODENOSCOPY (EGD) WITH PROPOFOL N/A 09/29/2017   Procedure: ESOPHAGOGASTRODUODENOSCOPY (EGD) WITH PROPOFOL WITH DILATION;  Surgeon: Jonathon Bellows, MD;  Location: [facility] ENDOSCOPY;  Service: Gastroenterology;  Laterality: N/A;  . HYSTEROSCOPY WITH D & C N/A 09/25/2017   Procedure: DILATATION AND CURETTAGE /HYSTEROSCOPY;  Surgeon: Ward, Honor Loh, MD;  Location: [facility];  Service: Gynecology;  Laterality: N/A;  . POLYPECTOMY  02/08/2016   Procedure: POLYPECTOMY;  Surgeon: Lucilla Lame, MD;  Location: [place];  Service: Endoscopy;;  . SHOULDER SURGERY Right 2000    Prior to Admission medications   Medication Sig Start Date End Date Taking? Authorizing Provider  bimatoprost (LATISSE) 0.03 % ophthalmic solution APPLY TO THE LASH LINE ONCE DAILY 06/21/19   [provider]  dexlansoprazole (DEXILANT) 60 MG capsule Take 60 mg by mouth daily.    [provider]  Omega-3 Fatty Acids (FISH OIL PO) Take 2,000 mg by mouth daily. Liquid    [provider]  OnabotulinumtoxinA (BOTOX IJ) Inject as directed. Administered ~ every 5 months for migraine prevention by practitioner in [facility]    [provider]  SUMAtriptan (IMITREX) 100 MG tablet Take 1 tablet (100 mg total) by mouth every 2 (two) hours as needed for migraine. May repeat in 2 hours if headache persists or recurs. 12/31/15   Glean Hess, MD  Cetirizine HCl (ZYRTEC ALLERGY) 10 MG TBDP Take 10 mg by mouth daily. 01/04/19 07/24/19  Karen Kitchens, NP  omeprazole (PRILOSEC) 40 MG capsule Take 1 capsule (40 mg total) by mouth daily. 09/17/17 01/04/19  Jonathon Bellows, MD  SPRINTEC 28 0.25-35 MG-MCG tablet Take 1 tablet by mouth at bedtime. 07/23/17 01/04/19  [provider]    Allergies No known allergies  Family History  Adopted: Yes  Problem Relation Age of Onset  . Diabetes Mother   . Heart  disease Father     Social History Social History   Tobacco Use  . Smoking status: Never Smoker  . Smokeless tobacco: Never Used  Vaping Use  . Vaping Use: Never used  Substance Use Topics  . Alcohol use: Yes    Comment: occ  . Drug use: No    Review of Systems Constitutional: No fever/chills Eyes: No visual changes. ENT: Positive for throat discomfort.  Cardiovascular: Denies chest pain. Respiratory: Denies shortness of breath. Gastrointestinal: No abdominal pain.  No nausea, no vomiting.  No diarrhea.   Genitourinary: Negative for dysuria. Musculoskeletal: Negative for back pain. Skin: Positive for swelling to face.  Neurological: Negative for headaches, focal weakness or numbness.  ____________________________________________   PHYSICAL EXAM:  VITAL SIGNS: ED Triage Vitals [03/09/20 2308]  Enc Vitals Group     BP 134/74     Pulse Rate 85     Resp 18     Temp 98.7 F (37.1 C)     Temp Source Oral     SpO2 99 %     Weight 146 lb (66.2 kg)     Height 5\' 2"  (1.575 m)     Head Circumference      Peak Flow      Pain Score 5   Constitutional: Alert and oriented.  Eyes: Conjunctivae are normal.  ENT      Head: Normocephalic and atraumatic.      Nose: No congestion/rhinnorhea.      Mouth/Throat: Mucous membranes are moist.      Neck: No stridor. Hematological/Lymphatic/Immunilogical: No cervical lymphadenopathy. Cardiovascular: Normal rate, regular rhythm.  No murmurs, rubs, or gallops.  Respiratory: Normal respiratory effort without tachypnea nor retractions. Breath sounds are clear and equal bilaterally. No wheezes/rales/rhonchi. Gastrointestinal: Soft and non tender. No rebound. No guarding.  Genitourinary: Deferred Musculoskeletal: Normal range of motion in all extremities. No lower extremity edema. Neurologic:  Normal speech and language. No gross focal neurologic deficits are appreciated.  Skin:  Swelling and erythema noted primarily over the right  cheek, some noted over the left cheek. Psychiatric: Mood and affect are normal. Speech and behavior are normal. Patient exhibits appropriate insight and judgment.  ____________________________________________    LABS (pertinent positives/negatives)  None  ____________________________________________   EKG  None  ____________________________________________    RADIOLOGY  None  ____________________________________________   PROCEDURES  Procedures  ____________________________________________   INITIAL IMPRESSION / ASSESSMENT AND PLAN / ED COURSE  Pertinent labs & imaging results that were available during my care of the patient were reviewed by me and considered in my medical decision making (see chart for details).   Patient presented to the emergency department today because of concern for facial swelling. Exam is most consistent with cellulitis, which could be related to recent insect sting. At this time I doubt allergic reaction given that the incident occurred over one week ago. Additionally patient denies any new soaps, creams, detergents or other new allergens. Will plan on prescribing antibiotics and steroids to help treat the cellulitis.   ___________________________________________   FINAL CLINICAL IMPRESSION(S) / ED DIAGNOSES  Final diagnoses:  Cellulitis of face     Note: This dictation was prepared with Dragon dictation. Any transcriptional errors that result from this process are unintentional     Nance Pear, MD 03/10/20 0041

## 2020-03-09 NOTE — ED Notes (Signed)
Pt stated got stung by a yellow jacket (Forehead) 7-days ago. Took clariton at 6:30pm.

## 2020-03-10 MED ORDER — PREDNISONE 20 MG PO TABS
60.0000 mg | ORAL_TABLET | Freq: Once | ORAL | Status: AC
  Administered 2020-03-10: 60 mg via ORAL
  Filled 2020-03-10: qty 3

## 2020-03-10 MED ORDER — DOXYCYCLINE HYCLATE 100 MG PO TABS
100.0000 mg | ORAL_TABLET | Freq: Once | ORAL | Status: AC
  Administered 2020-03-10: 100 mg via ORAL
  Filled 2020-03-10: qty 1

## 2020-03-10 MED ORDER — EPINEPHRINE 0.3 MG/0.3ML IJ SOAJ: 0.3000 mg | INTRAMUSCULAR | 0 refills | Status: AC | PRN

## 2020-03-10 MED ORDER — DOXYCYCLINE HYCLATE 100 MG PO CAPS: 100.0000 mg | ORAL_CAPSULE | Freq: Two times a day (BID) | ORAL | 0 refills | Status: AC

## 2020-03-10 MED ORDER — PREDNISONE 10 MG (21) PO TBPK: ORAL_TABLET | ORAL | 0 refills | Status: AC

## 2020-03-10 NOTE — Discharge Instructions (Signed)
Please seek medical attention for any high fevers, chest pain, shortness of breath, change in behavior, persistent vomiting, bloody stool or any other new or concerning symptoms.  

## 2021-01-17 NOTE — Telephone Encounter (Signed)
complete
